# Patient Record
Sex: Female | Born: 1970 | Race: Black or African American | Hispanic: No | Smoking: Never smoker
Health system: Southern US, Community
[De-identification: ages and names within clinical notes are randomized; demographics above are authoritative.]

## PROBLEM LIST (undated history)

## (undated) DIAGNOSIS — E785 Hyperlipidemia, unspecified: Secondary | ICD-10-CM

## (undated) DIAGNOSIS — T7840XA Allergy, unspecified, initial encounter: Secondary | ICD-10-CM

## (undated) HISTORY — PX: COLONOSCOPY: SHX174

## (undated) HISTORY — PX: ABDOMINAL HYSTERECTOMY: SHX81

## (undated) HISTORY — DX: Hyperlipidemia, unspecified: E78.5

## (undated) HISTORY — DX: Allergy, unspecified, initial encounter: T78.40XA

---

## 1998-07-11 ENCOUNTER — Ambulatory Visit (HOSPITAL_COMMUNITY): Admission: RE | Admit: 1998-07-11 | Discharge: 1998-07-11 | Payer: Self-pay | Admitting: Obstetrics and Gynecology

## 1998-10-12 ENCOUNTER — Ambulatory Visit (HOSPITAL_COMMUNITY): Admission: RE | Admit: 1998-10-12 | Discharge: 1998-10-12 | Payer: Self-pay | Admitting: Obstetrics and Gynecology

## 2002-05-26 ENCOUNTER — Emergency Department (HOSPITAL_COMMUNITY): Admission: EM | Admit: 2002-05-26 | Discharge: 2002-05-26 | Payer: Self-pay | Admitting: Emergency Medicine

## 2002-05-26 ENCOUNTER — Encounter: Payer: Self-pay | Admitting: Emergency Medicine

## 2002-10-10 ENCOUNTER — Encounter (INDEPENDENT_AMBULATORY_CARE_PROVIDER_SITE_OTHER): Payer: Self-pay | Admitting: *Deleted

## 2002-10-10 ENCOUNTER — Ambulatory Visit (HOSPITAL_COMMUNITY): Admission: RE | Admit: 2002-10-10 | Discharge: 2002-10-10 | Payer: Self-pay | Admitting: *Deleted

## 2003-10-08 ENCOUNTER — Emergency Department (HOSPITAL_COMMUNITY): Admission: AD | Admit: 2003-10-08 | Discharge: 2003-10-08 | Payer: Self-pay | Admitting: Family Medicine

## 2005-03-17 ENCOUNTER — Ambulatory Visit: Payer: Self-pay | Admitting: Internal Medicine

## 2005-03-19 ENCOUNTER — Ambulatory Visit: Payer: Self-pay

## 2005-03-31 ENCOUNTER — Ambulatory Visit: Payer: Self-pay | Admitting: Internal Medicine

## 2005-04-17 ENCOUNTER — Ambulatory Visit: Payer: Self-pay | Admitting: Internal Medicine

## 2005-06-21 ENCOUNTER — Encounter: Admission: RE | Admit: 2005-06-21 | Discharge: 2005-06-21 | Payer: Self-pay | Admitting: Internal Medicine

## 2005-12-24 ENCOUNTER — Encounter: Payer: Self-pay | Admitting: Obstetrics and Gynecology

## 2006-05-26 ENCOUNTER — Ambulatory Visit: Payer: Self-pay | Admitting: Internal Medicine

## 2006-07-06 ENCOUNTER — Ambulatory Visit (HOSPITAL_COMMUNITY): Admission: RE | Admit: 2006-07-06 | Discharge: 2006-07-06 | Payer: Self-pay | Admitting: *Deleted

## 2006-07-06 ENCOUNTER — Encounter (INDEPENDENT_AMBULATORY_CARE_PROVIDER_SITE_OTHER): Payer: Self-pay | Admitting: *Deleted

## 2006-11-07 ENCOUNTER — Emergency Department (HOSPITAL_COMMUNITY): Admission: EM | Admit: 2006-11-07 | Discharge: 2006-11-07 | Payer: Self-pay | Admitting: Family Medicine

## 2007-04-29 ENCOUNTER — Ambulatory Visit: Payer: Self-pay | Admitting: Internal Medicine

## 2007-04-30 ENCOUNTER — Telehealth: Payer: Self-pay | Admitting: Internal Medicine

## 2007-05-05 ENCOUNTER — Telehealth (INDEPENDENT_AMBULATORY_CARE_PROVIDER_SITE_OTHER): Payer: Self-pay | Admitting: *Deleted

## 2008-02-08 ENCOUNTER — Ambulatory Visit: Payer: Self-pay | Admitting: Internal Medicine

## 2008-02-08 DIAGNOSIS — L02419 Cutaneous abscess of limb, unspecified: Secondary | ICD-10-CM | POA: Insufficient documentation

## 2008-02-08 DIAGNOSIS — L03119 Cellulitis of unspecified part of limb: Secondary | ICD-10-CM | POA: Insufficient documentation

## 2008-03-28 ENCOUNTER — Telehealth: Payer: Self-pay | Admitting: Internal Medicine

## 2008-03-31 ENCOUNTER — Emergency Department (HOSPITAL_COMMUNITY): Admission: EM | Admit: 2008-03-31 | Discharge: 2008-03-31 | Payer: Self-pay | Admitting: Emergency Medicine

## 2008-04-01 ENCOUNTER — Emergency Department (HOSPITAL_COMMUNITY): Admission: EM | Admit: 2008-04-01 | Discharge: 2008-04-01 | Payer: Self-pay | Admitting: Emergency Medicine

## 2008-04-03 ENCOUNTER — Encounter: Payer: Self-pay | Admitting: Internal Medicine

## 2008-04-03 ENCOUNTER — Emergency Department (HOSPITAL_COMMUNITY): Admission: EM | Admit: 2008-04-03 | Discharge: 2008-04-03 | Payer: Self-pay | Admitting: Emergency Medicine

## 2009-08-16 ENCOUNTER — Encounter (INDEPENDENT_AMBULATORY_CARE_PROVIDER_SITE_OTHER): Payer: Self-pay | Admitting: *Deleted

## 2009-11-02 ENCOUNTER — Ambulatory Visit (HOSPITAL_COMMUNITY): Admission: RE | Admit: 2009-11-02 | Discharge: 2009-11-02 | Payer: Self-pay | Admitting: Psychiatry

## 2009-11-05 ENCOUNTER — Other Ambulatory Visit (HOSPITAL_COMMUNITY): Admission: RE | Admit: 2009-11-05 | Discharge: 2009-11-19 | Payer: Self-pay | Admitting: Psychiatry

## 2009-11-08 ENCOUNTER — Ambulatory Visit: Payer: Self-pay | Admitting: Psychiatry

## 2010-04-30 ENCOUNTER — Ambulatory Visit: Payer: Self-pay | Admitting: Internal Medicine

## 2010-05-02 ENCOUNTER — Ambulatory Visit: Payer: Self-pay | Admitting: Internal Medicine

## 2010-09-26 NOTE — Progress Notes (Signed)
Summary: would like a referral  Phone Note Call from Patient Call back at [phone]   Caller: Patient Call For: drk Summary of Call: pt would like a referral to ent for ear pain. Initial call taken by: Heron Sabins,  May 05, 2007 11:05 AM  Follow-up for Phone Call        floxin otic #14 packets  Use contents two packets daily for 7 days  Additional Follow-up for Phone Call Additional follow up Details #1::        She's already had this and Z-pak. No better. Advise. Additional Follow-up by: Raechel Ache, RN,  May 05, 2007 1:55 PM    Additional Follow-up for Phone Call Additional follow up Details #2::    OK to refer to ENT  Additional Follow-up for Phone Call Additional follow up Details #3:: Details for Additional Follow-up Action Taken: 09/18 @ 2:15 with Ezzard Standing patient aware Additional Follow-up by: Florentina Addison,  May 10, 2007 11:54 AM

## 2010-09-26 NOTE — Procedures (Signed)
Summary: Colon   Colonoscopy  Procedure date:  10/10/2002  Findings:      Location:  [facility].     NAME:  Amanda Petersen, Amanda Petersen                          ACCOUNT NO.:  [id number]   MEDICAL RECORD NO.:  [id number]                   PATIENT TYPE:  AMB   LOCATION:  ENDO                                 FACILITY:  [facility]   PHYSICIAN:  Georgiana Spinner, M.D.                 DATE OF BIRTH:  1971-05-25   DATE OF PROCEDURE:  10/10/2002  DATE OF DISCHARGE:                                 OPERATIVE REPORT   PROCEDURE PERFORMED:  Colonoscopy.   ENDOSCOPIST:  Georgiana Spinner, M.D.   INDICATIONS FOR PROCEDURE:  Gastrointestinal bleeding as noted previously.  See the recent outpatient note.   ANESTHESIA:  Demerol 100 mg, Versed 10 mg.   DESCRIPTION OF PROCEDURE:  With the patient mildly sedated in the left  lateral decubitus position, the Olympus video colonoscope was inserted in  the rectum and passed under direct vision to the cecum, identified by the  crow's foot of the cecum, the ileocecal valve.  We entered into the terminal  ileum.  All of these appeared normal.  From this point the colonoscope was  slowly withdrawn taking circumferential views of the entire colonic mucosa  stopping only in the rectum which appeared normal on direct and showed small  hemorrhoids on retroflex view and pullthrough of the anal canal.  The  endoscope was straightened and withdrawn.  The patient's vital signs and  pulse oximeter remained stable.  The patient tolerated the procedure well  without apparent complications.   FINDINGS:  Small internal hemorrhoids.  Otherwise unremarkable examination.   PLAN:  Have patient follow-up with me as needed.                                                 Georgiana Spinner, M.D.    GMO/MEDQ  D:  10/10/2002  T:  10/10/2002  Job:  [id number]   cc:   Gordy Savers, M.D. [facility]   Malachi Pro. Ambrose Mantle, M.D.  [address]  [place]  [place] [address]  Fax:  [phone]

## 2010-09-26 NOTE — Assessment & Plan Note (Signed)
Summary: TB TEST // RS  Nurse Visit   Vital Signs:  Patient profile:   40 year old female Temp:     98.5 degrees F oral  Vitals Entered By: Duard Brady LPN (April 30, 2010 4:19 PM)  Allergies: 1)  ! Pcn  Immunizations Administered:  PPD Skin Test:    Vaccine Type: PPD    Site: right forearm    Mfr: Sanofi Pasteur    Dose: 0.1 ml    Route: ID    Given by: Duard Brady LPN    Exp. Date: 06/27/2011    Lot #: [id number]    Physician counseled: yes  Orders Added: 1)  TB Skin Test [86580] 2)  Admin 1st Vaccine [90471]  pt has cxp on thursday - will read then , also has form to be filled otu at that time. KIK

## 2010-09-26 NOTE — Assessment & Plan Note (Signed)
Summary: CPX / CPX LABS V70.0 / TB READING / PT NEEDS FORM FOR JOB FIL...   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  History of Present Illness: 28 -year-old patient who is seen today for a wellness exam.  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  She has a gynecologist and had a normal Pap specimen last year.  No concerns or complaints.  a PPD was placed 48 hours ago, which was read today and was negative  Allergies: 1)  ! Pcn  Past History:  Past Medical History: Reviewed history from 04/29/2007 and no changes required. Unremarkable  Past Surgical History: Reviewed history from 04/29/2007 and no changes required. Colonoscopy Colectomy Hysterectomy  Family History: Reviewed history from 04/29/2007 and no changes required. Family History of Colon CA 1st degree relative <60 Family History Hypertension  Social History: Reviewed history from 04/29/2007 and no changes required. Occupation: [employer] Single Never Smoked Alcohol use-no  Review of Systems  The patient denies anorexia, fever, weight loss, weight gain, vision loss, decreased hearing, hoarseness, chest pain, syncope, dyspnea on exertion, peripheral edema, prolonged cough, headaches, hemoptysis, abdominal pain, melena, hematochezia, severe indigestion/heartburn, hematuria, incontinence, genital sores,  muscle weakness, suspicious skin lesions, transient blindness, difficulty walking, depression, unusual weight change, abnormal bleeding, enlarged lymph nodes, angioedema, and breast masses.    Physical Exam  General:  overweight-appearing.  normal blood pressure Head:  Normocephalic and atraumatic without obvious abnormalities. No apparent alopecia or balding. Eyes:  No corneal or conjunctival inflammation noted. EOMI. Perrla. Funduscopic exam benign, without hemorrhages, exudates or papilledema. Vision grossly normal. Ears:  External ear exam shows no significant lesions or deformities.  Otoscopic examination reveals clear canals, tympanic membranes are intact bilaterally without bulging, retraction, inflammation or discharge. Hearing is grossly normal bilaterally. Nose:  External nasal examination shows no deformity or inflammation. Nasal mucosa are pink and moist without lesions or exudates. Mouth:  Oral mucosa and oropharynx without lesions or exudates.  Teeth in good repair. Neck:  No deformities, masses, or tenderness noted. Chest Wall:  No deformities, masses, or tenderness noted. Lungs:  Normal respiratory effort, chest expands symmetrically. Lungs are clear to auscultation, no crackles or wheezes. Heart:  Normal rate and regular rhythm. S1 and S2 normal without gallop, murmur, click, rub or other extra sounds. Abdomen:  Bowel sounds positive,abdomen soft and non-tender without masses, organomegaly or hernias noted. Msk:  No deformity or scoliosis noted of thoracic or lumbar spine.   Pulses:  R and L carotid,radial,femoral,dorsalis pedis and posterior tibial pulses are full and equal bilaterally Extremities:  No clubbing, cyanosis, edema, or deformity noted with normal full range of motion of all joints.  Neurologic:  No cranial nerve deficits noted. Station and gait are normal. Plantar reflexes are down-going bilaterally. DTRs are symmetrical throughout. Sensory, motor and coordinative  functions appear intact. Skin:  Intact without suspicious lesions or rashes Cervical Nodes:  No lymphadenopathy noted Psych:  Cognition and judgment appear intact. Alert and cooperative with normal attention span and concentration. No apparent delusions, illusions, hallucinations   Impression & Recommendations:  Problem # 1:  Preventive Health Care (ICD-V70.0)  Complete Medication List: 1)  Miralax Powd (Polyethylene glycol 3350)  Patient Instructions: 1)  It is important that you exercise regularly at least 20 minutes 5 times a week. If you develop chest pain, have severe difficulty breathing, or feel very tired , stop exercising immediately and seek medical attention. 2)  You need to lose weight. Consider a lower calorie diet and regular exercise.    PPD Results    Date of reading: 05/02/2010    Results: < 5mm    Interpretation: negative

## 2010-09-26 NOTE — Letter (Signed)
Summary: Health Examination Certificate for [facility] Examination Certificate for School   Imported By: Maryln Gottron 05/06/2010 14:10:30  _____________________________________________________________________  External Attachment:    Type:   Image     Comment:   External Document

## 2010-09-26 NOTE — Procedures (Signed)
Summary: Colon   Colonoscopy  Procedure date:  07/06/2006  Findings:      Location:  [facility].   NAME:  Amanda Petersen, Amanda Petersen                ACCOUNT NO.:  [id number]   MEDICAL RECORD NO.:  [id number]          PATIENT TYPE:  AMB   LOCATION:  ENDO                         FACILITY:  [facility]   PHYSICIAN:  Georgiana Spinner, M.D.    DATE OF BIRTH:  04-24-1971   DATE OF PROCEDURE:  07/06/2006  DATE OF DISCHARGE:                                 OPERATIVE REPORT   PROCEDURE:  Colonoscopy.   INDICATIONS:  Rectal bleeding.   ANESTHESIA:  Fentanyl 75 mcg, Versed 6 mg.   PROCEDURE:  With the patient mildly sedated in the left lateral decubitus  position, a rectal examination was performed which was unremarkable.  Perineum was unremarkable.  Subsequently the Olympus videoscopic colonoscope  was inserted into the rectum and passed under direct vision to the cecum  identified by ileocecal valve and appendiceal orifice both which were  photographed.  From this point colonoscope was slowly withdrawn taking  circumferential views of colonic mucosa stopping in the rectum which  appeared normal on direct, showed hemorrhoids on retroflexed view.  There  was no evidence of fissure or other pathology in the rectum, the endoscope  was straightened and withdrawn through the anal canal which appeared  unremarkable.  The patient vital signs, pulse oximeter remained stable.  The  patient tolerated procedure well without apparent complication.   FINDINGS:  Internal hemorrhoids, otherwise an unremarkable colonoscopic  examination to the cecum.  Presume that bleeding was coming from the  hemorrhoids.   PLAN:  Will have patient follow-up with me as needed.           ______________________________  Georgiana Spinner, M.D.     GMO/MEDQ  D:  07/06/2006  T:  07/06/2006  Job:  [id number]   cc:   Gordy Savers, MD

## 2010-09-26 NOTE — Letter (Signed)
Summary: New Patient letter  [facility]  [address] [place], [place] [address]   Phone: [phone]  Fax: [phone]       08/16/2009 MRN: [id number]  Amanda Petersen [address] [place], [place]  [address]  Dear Amanda Petersen,  Welcome to the Gastroenterology Division at [facility].    You are scheduled to see Dr.  Rob Bunting on September 11, 2009 at 10:30am on the 3rd floor at [facility], [address]  We ask that you try to arrive at our office 15 minutes prior to your appointment time to allow for check-in.  We would like you to complete the enclosed self-administered evaluation form prior to your visit and bring it with you on the day of your appointment.  We will review it with you.  Also, please bring a complete list of all your medications or, if you prefer, bring the medication bottles and we will list them.  Please bring your insurance card so that we may make a copy of it.  If your insurance requires a referral to see a specialist, please bring your referral form from your primary care physician.  Co-payments are due at the time of your visit and may be paid by cash, check or credit card.     Your office visit will consist of a consult with your physician (includes a physical exam), any laboratory testing he/she may order, scheduling of any necessary diagnostic testing (e.g. x-ray, ultrasound, CT-scan), and scheduling of a procedure (e.g. Endoscopy, Colonoscopy) if required.  Please allow enough time on your schedule to allow for any/all of these possibilities.    If you cannot keep your appointment, please call [phone] to cancel or reschedule prior to your appointment date.  This allows [place] the opportunity to schedule an appointment for another patient in need of care.  If you do not cancel or reschedule by 5 p.m. the business day prior to your appointment date, you will be charged a $50.00 late cancellation/no-show fee.    Thank you for  choosing [facility] for your medical needs.  We appreciate the opportunity to care for you.  Please visit [place] at our website  to learn more about our practice.                     Sincerely,                                                             The Gastroenterology Division

## 2010-09-26 NOTE — Consult Note (Signed)
Summary: [facility]  [facility]   Imported By: Maryln Gottron 04/19/2008 14:46:06  _____________________________________________________________________  External Attachment:    Type:   Image     Comment:   External Document

## 2010-09-26 NOTE — Assessment & Plan Note (Signed)
Summary: NOT FEELING WELL/PT TO COME AT 10AM/CCM   Vital Signs:  Patient Profile:   40 Years Old Female Weight:      200 pounds Temp:     98.4 degrees F oral Pulse rhythm:   regular Resp:     12 per minute BP sitting:   120 / 82  Vitals Entered By: Lynann Beaver CMA (April 29, 2007 10:06 AM)                 Chief Complaint:  c/o right sided headache and ear pain.  Acute Visit History:      The patient complains of earache.  These symptoms began 2 days ago.  Other comments include: no fever sxs got worse last night. .        The earache is located on the right side.  There is no history of recent antibiotic usage, cold/URI symptoms, or recurrent otitis media associated with the earache.         Current Allergies: ! PCN  Past Medical History:    Unremarkable     Review of Systems       no other complaints, no hearing loss   Physical Exam  General:     Well-developed,well-nourished,in no acute distress; alert,appropriate and cooperative throughout examination Head:     Normocephalic and atraumatic without obvious abnormalities. No apparent alopecia or balding. Ears:     L ear normal and no external deformities.   r ear canal tender and swollen Neck:     No deformities, masses, or tenderness noted.    Impression & Recommendations:  Problem # 1:  OTITIS EXTERNA (ICD-380.10)  Her updated medication list for this problem includes:    Cortisporin 3.5-10000-1 Soln (Neomycin-polymyxin-hc) .Marland Kitchen... 2 drops right ear three times a day for 5 days   Complete Medication List: 1)  Miralax Powd (Polyethylene glycol 3350) 2)  Vicodin 5-500 Mg Tabs (Hydrocodone-acetaminophen) 3)  Cortisporin 3.5-10000-1 Soln (Neomycin-polymyxin-hc) .... 2 drops right ear three times a day for 5 days     Prescriptions: CORTISPORIN 3.5-10000-1  SOLN (NEOMYCIN-POLYMYXIN-HC) 2 drops right ear three times a day for 5 days  #1 vial x 0   Entered and Authorized by:   Birdie Sons MD   Signed by:   Birdie Sons MD on 04/29/2007   Method used:   Electronically sent to ...       [id number] [address]       [address]       [place], [place]  [address]       Ph: [phone] or [phone]       Fax: [phone]   RxID:   [id number]

## 2010-09-26 NOTE — Progress Notes (Signed)
Summary: NO SHOW  Phone Note Outgoing Call   Call placed by: Raechel Ache, RN,  March 28, 2008 10:19 AM Summary of Call: [facility] re missed appt.

## 2010-09-26 NOTE — Assessment & Plan Note (Signed)
Summary: ? infected scrape/ccm   Vital Signs:  Patient Profile:   40 Years Old Female Height:     67 inches Weight:      209 pounds Temp:     98.1 degrees F BP sitting:   114 / 82  (left arm) Cuff size:   regular  Vitals Entered By: Raechel Ache, RN (February 08, 2008 4:43 PM)                 Chief Complaint:  C/o carpet burn RLE x 10 days.Marland Kitchen  History of Present Illness: 40 year old who suffered some soft tissue trauma involving her right lateral lower leg approximately 10 days ago.  She has been treating the wound with antibiotic ointment and peroxide cleansing.  For the past few days.  She developed increasing pain and a surrounding rim of erythema    Current Allergies: ! PCN      Physical Exam  General:     overweight-appearing.   Skin:     there is an approximate 3-cm crusted lesion involving her right lateral lower leg.  There is surrounding erythema and slight tenderness    Impression & Recommendations:  Problem # 1:  CELLULITIS, RIGHT LEG (ICD-682.6)  Her updated medication list for this problem includes:    Cephalexin 500 Mg Caps (Cephalexin) ..... One twice daily   Complete Medication List: 1)  Miralax Powd (Polyethylene glycol 3350) 2)  Vicodin 5-500 Mg Tabs (Hydrocodone-acetaminophen) 3)  Cephalexin 500 Mg Caps (Cephalexin) .... One twice daily   Patient Instructions: 1)   clean the wound twice daily; 2)  call if pain, redness, or fever develops 3)  Take your antibiotic as prescribed until ALL of it is gone, but stop if you develop a rash or swelling and contact our office as soon as possible.   Prescriptions: CEPHALEXIN 500 MG  CAPS (CEPHALEXIN) one twice daily  #14 x 0   Entered and Authorized by:   Gordy Savers  MD   Signed by:   Gordy Savers  MD on 02/08/2008   Method used:   Print then Give to Patient   RxID:   [id number]  ]

## 2010-09-26 NOTE — Progress Notes (Signed)
Summary: NEEDS RX  Phone Note Call from Patient Call back at [phone]   Caller: Patient Call For: DR KWIA/DR Leaha Cuervo Reason for Call: Talk to Doctor Summary of Call: PT SAW DR Murriel Holwerda YESTERDAY AND WAS GIVER EAR DROPS FOR HER EARS. IT IS GETTING WORSE. SHE DOES NOT WANTS TO COME IN. SHE [facility]S [phone]. SHE IS HAVING A LOT OF PAIN. Initial call taken by: Warnell Forester,  April 30, 2007 12:27 PM  Follow-up for Phone Call        continue ear drops add zpack  Follow-up by: Birdie Sons MD,  April 30, 2007 3:49 PM  Additional Follow-up for Phone Call Additional follow up Details #1::        [facility] [address], pt informed. Additional Follow-up by: Sid Falcon LPN,  April 30, 2007 4:01 PM

## 2011-01-10 NOTE — Op Note (Signed)
NAMEZEINAB, RODWELL NO.:  [id number]   MEDICAL RECORD NO.:  [id number]          PATIENT TYPE:  AMB   LOCATION:  ENDO                         FACILITY:  [facility]   PHYSICIAN:  Georgiana Spinner, M.D.    DATE OF BIRTH:  01-31-1971   DATE OF PROCEDURE:  07/06/2006  DATE OF DISCHARGE:                                 OPERATIVE REPORT   PROCEDURE:  Colonoscopy.   INDICATIONS:  Rectal bleeding.   ANESTHESIA:  Fentanyl 75 mcg, Versed 6 mg.   PROCEDURE:  With the patient mildly sedated in the left lateral decubitus  position, a rectal examination was performed which was unremarkable.  Perineum was unremarkable.  Subsequently the Olympus videoscopic colonoscope  was inserted into the rectum and passed under direct vision to the cecum  identified by ileocecal valve and appendiceal orifice both which were  photographed.  From this point colonoscope was slowly withdrawn taking  circumferential views of colonic mucosa stopping in the rectum which  appeared normal on direct, showed hemorrhoids on retroflexed view.  There  was no evidence of fissure or other pathology in the rectum, the endoscope  was straightened and withdrawn through the anal canal which appeared  unremarkable.  The patient vital signs, pulse oximeter remained stable.  The  patient tolerated procedure well without apparent complication.   FINDINGS:  Internal hemorrhoids, otherwise an unremarkable colonoscopic  examination to the cecum.  Presume that bleeding was coming from the  hemorrhoids.   PLAN:  Will have patient follow-up with me as needed.           ______________________________  Georgiana Spinner, M.D.     GMO/MEDQ  D:  07/06/2006  T:  07/06/2006  Job:  [id number]   cc:   Gordy Savers, MD

## 2011-01-10 NOTE — Op Note (Signed)
   NAME:  Amanda Petersen, Amanda Petersen                          ACCOUNT NO.:  [id number]   MEDICAL RECORD NO.:  [id number]                   PATIENT TYPE:  AMB   LOCATION:  ENDO                                 FACILITY:  [facility]   PHYSICIAN:  Georgiana Spinner, M.D.                 DATE OF BIRTH:  09-01-1970   DATE OF PROCEDURE:  10/10/2002  DATE OF DISCHARGE:                                 OPERATIVE REPORT   PROCEDURE PERFORMED:  Colonoscopy.   ENDOSCOPIST:  Georgiana Spinner, M.D.   INDICATIONS FOR PROCEDURE:  Gastrointestinal bleeding as noted previously.  See the recent outpatient note.   ANESTHESIA:  Demerol 100 mg, Versed 10 mg.   DESCRIPTION OF PROCEDURE:  With the patient mildly sedated in the left  lateral decubitus position, the Olympus video colonoscope was inserted in  the rectum and passed under direct vision to the cecum, identified by the  crow's foot of the cecum, the ileocecal valve.  We entered into the terminal  ileum.  All of these appeared normal.  From this point the colonoscope was  slowly withdrawn taking circumferential views of the entire colonic mucosa  stopping only in the rectum which appeared normal on direct and showed small  hemorrhoids on retroflex view and pullthrough of the anal canal.  The  endoscope was straightened and withdrawn.  The patient's vital signs and  pulse oximeter remained stable.  The patient tolerated the procedure well  without apparent complications.   FINDINGS:  Small internal hemorrhoids.  Otherwise unremarkable examination.   PLAN:  Have patient follow-up with me as needed.                                                 Georgiana Spinner, M.D.    GMO/MEDQ  D:  10/10/2002  T:  10/10/2002  Job:  [id number]   cc:   Gordy Savers, M.D. [facility]   Malachi Pro. Ambrose Mantle, M.D.  [address]  [place]  [place] [address]  Fax: [phone]

## 2011-10-28 ENCOUNTER — Encounter: Payer: Self-pay | Admitting: Internal Medicine

## 2011-10-28 ENCOUNTER — Ambulatory Visit (INDEPENDENT_AMBULATORY_CARE_PROVIDER_SITE_OTHER): Payer: Managed Care, Other (non HMO) | Admitting: Internal Medicine

## 2011-10-28 VITALS — BP 110/83 | Temp 101.1°F | Wt 204.0 lb

## 2011-10-28 DIAGNOSIS — K529 Noninfective gastroenteritis and colitis, unspecified: Secondary | ICD-10-CM

## 2011-10-28 DIAGNOSIS — K5289 Other specified noninfective gastroenteritis and colitis: Secondary | ICD-10-CM

## 2011-10-28 MED ORDER — PROMETHAZINE HCL 25 MG PO TABS: 25.0000 mg | ORAL_TABLET | Freq: Three times a day (TID) | ORAL | Status: DC | PRN

## 2011-10-28 NOTE — Progress Notes (Signed)
  Subjective:    Patient ID: Amanda Petersen, female    DOB: 01-10-1971, 41 y.o.   MRN: [id number]  HPI  41 year old patient who presents with a two-day history of high fever weakness malaise and intermittent diarrhea. She has had significant nausea. She was unable to work today and do to profound weakness and fever. Her husband had a similar illness that lasted 3 days. No vomiting denies any abdominal pain or distention    Review of Systems  Constitutional: Positive for fever, activity change, appetite change and fatigue.  HENT: Negative for hearing loss, congestion, sore throat, rhinorrhea, dental problem, sinus pressure and tinnitus.   Eyes: Negative for pain, discharge and visual disturbance.  Respiratory: Negative for cough and shortness of breath.   Cardiovascular: Negative for chest pain, palpitations and leg swelling.  Gastrointestinal: Positive for nausea and diarrhea. Negative for vomiting, abdominal pain, constipation, blood in stool and abdominal distention.  Genitourinary: Negative for dysuria, urgency, frequency, hematuria, flank pain, vaginal bleeding, vaginal discharge, difficulty urinating, vaginal pain and pelvic pain.  Musculoskeletal: Negative for joint swelling, arthralgias and gait problem.  Skin: Negative for rash.  Neurological: Negative for dizziness, syncope, speech difficulty, weakness, numbness and headaches.  Hematological: Negative for adenopathy.  Psychiatric/Behavioral: Negative for behavioral problems, dysphoric mood and agitation. The patient is not nervous/anxious.        Objective:   Physical Exam  Constitutional: She is oriented to person, place, and time. She appears well-developed and well-nourished.       Appears unwell but in no acute distress Temperature 101 Blood pressure normal  HENT:  Head: Normocephalic.  Right Ear: External ear normal.  Left Ear: External ear normal.  Mouth/Throat: Oropharynx is clear and moist.  Eyes: Conjunctivae and  EOM are normal. Pupils are equal, round, and reactive to light.  Neck: Normal range of motion. Neck supple. No thyromegaly present.  Cardiovascular: Normal rate, regular rhythm, normal heart sounds and intact distal pulses.   Pulmonary/Chest: Effort normal and breath sounds normal.  Abdominal: Soft. Bowel sounds are normal. She exhibits no mass. There is no tenderness. There is no rebound and no guarding.  Musculoskeletal: Normal range of motion.  Lymphadenopathy:    She has no cervical adenopathy.  Neurological: She is alert and oriented to person, place, and time.  Skin: Skin is warm and dry. No rash noted.  Psychiatric: She has a normal mood and affect. Her behavior is normal.          Assessment & Plan:   Viral gastroenteritis. Will treat symptomatically. Prescription for Phenergan called in.

## 2011-10-28 NOTE — Patient Instructions (Signed)
The main concern with gastroenteritis is dehydration.  Drink  plenty of  fluids, and advance your diet  slowly to solids as you feel improved.  If you are unable to keep anything down or  Show  signs of dehydration such as dry, cracked lips, or  not urinating, please notify our office.  Phenergan 25 mg every 6 hours as needed for nausea  Prilosec one daily Align  one daily  Tylenol 1-2 tabs po q4h prn

## 2012-11-23 ENCOUNTER — Emergency Department (HOSPITAL_COMMUNITY)
Admission: EM | Admit: 2012-11-23 | Discharge: 2012-11-23 | Disposition: A | Discharge status: Home / Self Care | Payer: Self-pay | Source: Home / Self Care

## 2013-02-06 ENCOUNTER — Emergency Department (INDEPENDENT_AMBULATORY_CARE_PROVIDER_SITE_OTHER): Payer: BC Managed Care – PPO

## 2013-02-06 ENCOUNTER — Encounter (HOSPITAL_COMMUNITY): Payer: Self-pay | Admitting: *Deleted

## 2013-02-06 ENCOUNTER — Emergency Department (HOSPITAL_COMMUNITY)
Admission: EM | Admit: 2013-02-06 | Discharge: 2013-02-06 | Disposition: A | Discharge status: Home / Self Care | Payer: BC Managed Care – PPO | Source: Home / Self Care | Attending: Emergency Medicine | Admitting: Emergency Medicine

## 2013-02-06 DIAGNOSIS — S92001A Unspecified fracture of right calcaneus, initial encounter for closed fracture: Secondary | ICD-10-CM

## 2013-02-06 DIAGNOSIS — S93409A Sprain of unspecified ligament of unspecified ankle, initial encounter: Secondary | ICD-10-CM

## 2013-02-06 DIAGNOSIS — S92009A Unspecified fracture of unspecified calcaneus, initial encounter for closed fracture: Secondary | ICD-10-CM

## 2013-02-06 DIAGNOSIS — S93401A Sprain of unspecified ligament of right ankle, initial encounter: Secondary | ICD-10-CM

## 2013-02-06 DIAGNOSIS — W010XXA Fall on same level from slipping, tripping and stumbling without subsequent striking against object, initial encounter: Secondary | ICD-10-CM

## 2013-02-06 MED ORDER — HYDROCODONE-ACETAMINOPHEN 7.5-325 MG PO TABS: 1.0000 | ORAL_TABLET | Freq: Four times a day (QID) | ORAL | Status: DC | PRN

## 2013-02-06 NOTE — ED Notes (Signed)
Patient states she twisted her ankle in the shower this morning; swelling and bruising.

## 2013-02-06 NOTE — ED Provider Notes (Signed)
History     CSN: [id number]  Arrival date & time 02/06/13  1110   First MD Initiated Contact with Patient 02/06/13 1302      Chief Complaint  Patient presents with  . Ankle Pain    (Consider location/radiation/quality/duration/timing/severity/associated sxs/prior treatment) HPI  41yo bf presents today with right lat ankle/foot pain and swelling.  States that she got out of the shower this morning, slipped and suffered a plantar flexion/inversion injury to her ankle.  Marked discomfort after injury along with swelling.  Difficulty weightbearing.  Has had some numbness lat foot.  Denies any other injury.  Only previous issue with this foot has been plantar fasciitis.    History reviewed. No pertinent past medical history.  Past Surgical History  Procedure Laterality Date  . Abdominal hysterectomy      No family history on file.  History  Substance Use Topics  . Smoking status: Never Smoker   . Smokeless tobacco: Never Used  . Alcohol Use: No    OB History   Grav Para Term Preterm Abortions TAB SAB Ect Mult Living                  Review of Systems  Constitutional: Negative.   HENT: Negative.   Eyes: Negative.   Respiratory: Negative.   Cardiovascular: Negative.   Gastrointestinal: Negative.   Endocrine: Negative.   Genitourinary: Negative.   Musculoskeletal: Positive for joint swelling (right lat ankle and foot) and arthralgias (lateral right ankle ).  Skin: Negative.   Neurological: Negative.     Allergies  Penicillins  Home Medications   Current Outpatient Rx  Name  Route  Sig  Dispense  Refill  . EXPIRED: promethazine (PHENERGAN) 25 MG tablet   Oral   Take 1 tablet (25 mg total) by mouth every 8 (eight) hours as needed for nausea.   20 tablet   0     BP 157/95  Pulse 107  Temp(Src) 99.5 F (37.5 C) (Oral)  Resp 20  SpO2 100%  Physical Exam  Constitutional: She is oriented to person, place, and time. She appears well-developed and  well-nourished.  HENT:  Head: Normocephalic and atraumatic.  Eyes: EOM are normal. Pupils are equal, round, and reactive to light.  Neck: Normal range of motion.  Cardiovascular: Normal rate, regular rhythm and normal heart sounds.   Pulmonary/Chest: Effort normal and breath sounds normal.  Musculoskeletal:  Right lateral foot mod swelling and mod-severe tenderness. Mod tender over the ATFL.  Difficult to assess ligament stability due to pain.  NVI.  Good ankle range of motion.    Neurological: She is alert and oriented to person, place, and time.  Skin: Skin is warm.  Psychiatric: She has a normal mood and affect.    ED Course  Procedures (including critical care time)  Labs Reviewed - No data to display Dg Ankle Complete Right  02/06/2013   *RADIOLOGY REPORT*  Clinical Data: Injury with right ankle and foot pain and lateral swelling.  RIGHT ANKLE - COMPLETE 3+ VIEW  Comparison: None.  Findings: Extensive lateral soft tissue swelling is identified. There is a bone fragment off the lateral margin of the distal calcaneus likely due to the extensor digitorum brevis avulsion fracture.  IMPRESSION: Findings most consistent with an avulsion fracture of the origin of the extensor digitorum brevis.   Original Report Authenticated By: Holley Dexter, M.D.   Dg Foot 2 Views Right  02/06/2013   *RADIOLOGY REPORT*  Clinical Data: Injury.  Lateral  right foot pain and swelling.  RIGHT FOOT - 2 VIEW  Comparison: None.  Findings: A bony fragment is seen off the distal margin of the calcaneus on the lateral side most consistent with an avulsion fracture at the origin of the extensor digitorum brevis.  Overlying soft tissue swelling is noted.  No other acute abnormality is identified.  IMPRESSION: Avulsion fracture of the distal calcaneus at the origin of the extensor digitorum brevis.  Associated soft tissue swelling noted.   Original Report Authenticated By: Holley Dexter, M.D.     No diagnosis  found.    MDM  Patient was put in a cam walker and given crutches.  Can wbat in boot only.  Will f/u with [facility] this week.  Call to make an appt.  F/u with [place] as needed.  Discharge instructions given.     Meds ordered this encounter  Medications  . HYDROcodone-acetaminophen (NORCO) 7.5-325 MG per tablet    Sig: Take 1 tablet by mouth every 6 (six) hours as needed for pain.    Dispense:  50 tablet    Refill:  0          Naida Sleight, PA-C 02/06/13 1327

## 2013-02-06 NOTE — ED Notes (Signed)
Patient refused crutches she states she will obtain crutches from elsewhere.

## 2013-05-03 ENCOUNTER — Encounter: Payer: Self-pay | Admitting: Family Medicine

## 2013-05-03 ENCOUNTER — Ambulatory Visit (INDEPENDENT_AMBULATORY_CARE_PROVIDER_SITE_OTHER): Payer: BC Managed Care – PPO | Admitting: Family Medicine

## 2013-05-03 VITALS — BP 118/78 | Temp 98.8°F | Wt 224.0 lb

## 2013-05-03 DIAGNOSIS — J398 Other specified diseases of upper respiratory tract: Secondary | ICD-10-CM

## 2013-05-03 DIAGNOSIS — J029 Acute pharyngitis, unspecified: Secondary | ICD-10-CM

## 2013-05-03 DIAGNOSIS — J399 Disease of upper respiratory tract, unspecified: Secondary | ICD-10-CM

## 2013-05-03 DIAGNOSIS — E669 Obesity, unspecified: Secondary | ICD-10-CM

## 2013-05-03 NOTE — Addendum Note (Signed)
Addended by: Azucena Freed on: 05/03/2013 02:50 PM   Modules accepted: Orders

## 2013-05-03 NOTE — Progress Notes (Signed)
Chief Complaint  Patient presents with  . Sore Throat    hoarseness, fever     HPI:  Acute visit for sore throat: -started about 4-5 days ago -has allergies and takes zyrtec for this -symptoms: had low grade fever a few days ago, but that resolved, sore throat, hoarseness, drainage, nasal congestion, ears full -denies: fever in last few days, SOB, NVD, tooth pain  Weight issues: -wants a weight loss medication  ROS: See pertinent positives and negatives per HPI.  No past medical history on file.  Past Surgical History  Procedure Laterality Date  . Abdominal hysterectomy      No family history on file.  History   Social History  . Marital Status: Divorced    Spouse Name: N/A    Number of Children: N/A  . Years of Education: N/A   Social History Main Topics  . Smoking status: Never Smoker   . Smokeless tobacco: Never Used  . Alcohol Use: No  . Drug Use: No  . Sexual Activity: None   Other Topics Concern  . None   Social History Narrative  . None    Current outpatient prescriptions:HYDROcodone-acetaminophen (NORCO) 7.5-325 MG per tablet, Take 1 tablet by mouth every 6 (six) hours as needed for pain., Disp: 50 tablet, Rfl: 0;  promethazine (PHENERGAN) 25 MG tablet, Take 1 tablet (25 mg total) by mouth every 8 (eight) hours as needed for nausea., Disp: 20 tablet, Rfl: 0  EXAM:  Filed Vitals:   05/03/13 1426  BP: 118/78  Temp: 98.8 F (37.1 C)    Body mass index is 35.62 kg/(m^2).  GENERAL: vitals reviewed and listed above, alert, oriented, appears well hydrated and in no acute distress  HEENT: atraumatic, conjunttiva clear, no obvious abnormalities on inspection of external nose and ears, normal appearance of ear canals and TMs, clear nasal congestion, mild post oropharyngeal erythema with PND, no tonsillar edema or exudate, no sinus TTP  NECK: no obvious masses on inspection  LUNGS: clear to auscultation bilaterally, no wheezes, rales or rhonchi, good  air movement  CV: HRRR, no peripheral edema  MS: moves all extremities without noticeable abnormality  PSYCH: pleasant and cooperative, no obvious depression or anxiety  ASSESSMENT AND PLAN:  Discussed the following assessment and plan:  Upper respiratory disease  Obesity, unspecified  Sore throat  -symptoms c/w with most likely a VURI - advised supportive measures and return precautions - strep culture pending due to pt concern -advised she should see her PCP regarding weight loss medications and advised her of the [place] [facility] and advised diet and exercise in the meantime -Patient advised to return or notify a doctor immediately if symptoms worsen or persist or new concerns arise.  Patient Instructions  -INSTRUCTIONS FOR UPPER RESPIRATORY INFECTION:  -plenty of rest and fluids  -nasal saline wash 2-3 times daily (use prepackaged nasal saline or bottled/distilled water if making your own)   -can use sinex or afrin nasal spray for drainage and nasal congestion - but do NOT use longer then 3-4 days  -can use tylenol or ibuprofen as directed for aches and sorethroat  -in the winter time, using a humidifier at night is helpful (please follow cleaning instructions)  -if you are taking a cough medication - use only as directed, may also try a teaspoon of honey to coat the throat and throat lozenges  -for sore throat, salt water gargles can help  -follow up if you have fevers, facial pain, tooth pain, difficulty breathing  or are worsening or not getting better in 5-7 days      Joao Mccurdy, [facility] R.

## 2013-05-03 NOTE — Patient Instructions (Addendum)
-  INSTRUCTIONS FOR UPPER RESPIRATORY INFECTION:  -plenty of rest and fluids  -nasal saline wash 2-3 times daily (use prepackaged nasal saline or bottled/distilled water if making your own)   -can use sinex or afrin nasal spray for drainage and nasal congestion - but do NOT use longer then 3-4 days  -can use tylenol or ibuprofen as directed for aches and sorethroat  -in the winter time, using a humidifier at night is helpful (please follow cleaning instructions)  -if you are taking a cough medication - use only as directed, may also try a teaspoon of honey to coat the throat and throat lozenges  -for sore throat, salt water gargles can help  -follow up if you have fevers, facial pain, tooth pain, difficulty breathing or are worsening or not getting better in 5-7 days   For your weight: We recommend the following healthy lifestyle measures: - eat a healthy diet consisting of lots of vegetables, fruits, beans, nuts, seeds, healthy meats such as white chicken and fish and whole grains.  - avoid fried foods, fast food, processed foods, sodas, red meet and other fattening foods.  - get a least 150 minutes of aerobic exercise per week.  Follow up with Dr. Amador Cunas

## 2013-05-05 LAB — CULTURE, GROUP A STREP: Organism ID, Bacteria: NORMAL

## 2013-05-10 NOTE — Progress Notes (Signed)
Quick Note:  Left a message for return call. ______ 

## 2013-05-12 NOTE — Progress Notes (Signed)
Quick Note:  Called and spoke with pt and pt is aware. Pt states she is feeling better. ______ 

## 2013-05-12 NOTE — Progress Notes (Signed)
Quick Note:  Left a message for return call. ______ 

## 2013-11-08 ENCOUNTER — Ambulatory Visit (INDEPENDENT_AMBULATORY_CARE_PROVIDER_SITE_OTHER): Payer: BC Managed Care – PPO | Admitting: Internal Medicine

## 2013-11-08 ENCOUNTER — Encounter: Payer: Self-pay | Admitting: Internal Medicine

## 2013-11-08 VITALS — BP 120/70 | HR 87 | Temp 98.9°F | Resp 20 | Ht 66.5 in | Wt 225.0 lb

## 2013-11-08 DIAGNOSIS — R300 Dysuria: Secondary | ICD-10-CM

## 2013-11-08 DIAGNOSIS — N390 Urinary tract infection, site not specified: Secondary | ICD-10-CM

## 2013-11-08 DIAGNOSIS — R350 Frequency of micturition: Secondary | ICD-10-CM

## 2013-11-08 LAB — POCT URINALYSIS DIPSTICK
Blood, UA: NEGATIVE
Glucose, UA: NEGATIVE
Ketones, UA: 15
Nitrite, UA: NEGATIVE
Protein, UA: NEGATIVE
Spec Grav, UA: >=1.03
Urobilinogen, UA: 0.2
pH, UA: 6.5

## 2013-11-08 MED ORDER — CIPROFLOXACIN HCL 500 MG PO TABS: 500.0000 mg | ORAL_TABLET | Freq: Two times a day (BID) | ORAL | Status: DC

## 2013-11-08 NOTE — Progress Notes (Signed)
Pre-visit discussion using our clinic review tool. No additional management support is needed unless otherwise documented below in the visit note.  

## 2013-11-08 NOTE — Progress Notes (Signed)
   Subjective:    Patient ID: Amanda Petersen, female    DOB: 02-02-1971, 43 y.o.   MRN: [id number]  HPI  43 year old patient who presents with a 4 to five-day history of recent dysuria, frequency, and nocturia.  No fever, flank pain, or chills.  She has had only rare bladder infections in the past.  No vaginal discharge  History reviewed. No pertinent past medical history.  History   Social History  . Marital Status: Divorced    Spouse Name: N/A    Number of Children: N/A  . Years of Education: N/A   Occupational History  . Not on file.   Social History Main Topics  . Smoking status: Never Smoker   . Smokeless tobacco: Never Used  . Alcohol Use: No  . Drug Use: No  . Sexual Activity: Not on file   Other Topics Concern  . Not on file   Social History Narrative  . No narrative on file    Past Surgical History  Procedure Laterality Date  . Abdominal hysterectomy      No family history on file.  Allergies  Allergen Reactions  . Penicillins     No current outpatient prescriptions on file prior to visit.   No current facility-administered medications on file prior to visit.    BP 120/70  Pulse 87  Temp(Src) 98.9 F (37.2 C) (Oral)  Resp 20  Ht 5' 6.5" (1.689 m)  Wt 225 lb (102.059 kg)  BMI 35.78 kg/m2  SpO2 96%       Review of Systems  Constitutional: Negative.   HENT: Negative for congestion, dental problem, hearing loss, rhinorrhea, sinus pressure, sore throat and tinnitus.   Eyes: Negative for pain, discharge and visual disturbance.  Respiratory: Negative for cough and shortness of breath.   Cardiovascular: Negative for chest pain, palpitations and leg swelling.  Gastrointestinal: Negative for nausea, vomiting, abdominal pain, diarrhea, constipation, blood in stool and abdominal distention.  Genitourinary: Positive for dysuria, urgency and frequency. Negative for hematuria, flank pain, vaginal bleeding, vaginal discharge, difficulty urinating,  vaginal pain and pelvic pain.  Musculoskeletal: Negative for arthralgias, gait problem and joint swelling.  Skin: Negative for rash.  Neurological: Negative for dizziness, syncope, speech difficulty, weakness, numbness and headaches.  Hematological: Negative for adenopathy.  Psychiatric/Behavioral: Negative for behavioral problems, dysphoric mood and agitation. The patient is not nervous/anxious.        Objective:   Physical Exam  Constitutional: She appears well-developed and well-nourished. No distress.  Abdominal: There is no tenderness.          Assessment & Plan:   Acute UTI.  Will treat with ciprofloxacin for 5 days

## 2013-11-08 NOTE — Patient Instructions (Signed)
Take your antibiotic as prescribed until ALL of it is gone, but stop if you develop a rash, swelling, or any side effects of the medication.  Contact our office as soon as possible if  there are side effects of the medication. 

## 2013-11-14 ENCOUNTER — Telehealth: Payer: Self-pay | Admitting: Internal Medicine

## 2013-11-14 ENCOUNTER — Telehealth: Payer: Self-pay

## 2013-11-14 ENCOUNTER — Encounter: Payer: BC Managed Care – PPO | Admitting: Family Medicine

## 2013-11-14 MED ORDER — NITROFURANTOIN MONOHYD MACRO 100 MG PO CAPS: 100.0000 mg | ORAL_CAPSULE | Freq: Two times a day (BID) | ORAL | Status: DC

## 2013-11-14 MED ORDER — AMOXICILLIN 250 MG PO CAPS: 250.0000 mg | ORAL_CAPSULE | Freq: Three times a day (TID) | ORAL | Status: DC

## 2013-11-14 NOTE — Telephone Encounter (Signed)
Pt had an appointment today with Dr. Selena BattenKim and cancelled. Please advise

## 2013-11-14 NOTE — Telephone Encounter (Signed)
Spoke to pt told her Rx for Amoxicillin 250 mg one tablet three times a day with meals sent to pharmacy. Pt verbalized understanding.

## 2013-11-14 NOTE — Telephone Encounter (Signed)
Spoke to pt told her new Rx sent to pharmacy for Macrobid. Pt verbalized understanding.

## 2013-11-14 NOTE — Telephone Encounter (Signed)
Pt req Dr Kirtland BouchardK call in her another RX  for her UTI

## 2013-11-14 NOTE — Progress Notes (Signed)
error    This encounter was created in error - please disregard.

## 2013-11-14 NOTE — Telephone Encounter (Signed)
Pt is allergic to penicillins, an amoxicillin was called in earlier to pharmacy. Please call in something different for patient.  Thanks

## 2013-11-14 NOTE — Telephone Encounter (Signed)
Amoxicillin 250 #21, one 3 times a day with meals

## 2013-11-14 NOTE — Telephone Encounter (Signed)
Please see message and advise 

## 2013-11-14 NOTE — Telephone Encounter (Signed)
Discussed allergy to PCN with Dr. Kirtland BouchardK, he said to cancel order for Amoxicillin and order Macrobid 100 mg po BID x 7 days, disp # 14.

## 2014-06-11 IMAGING — CR DG ANKLE COMPLETE 3+V*R*
3 series · 3 of 3 positions shown · non-contrast
Comparison: None.

CLINICAL DATA: Injury with right ankle and foot pain and lateral
swelling.

RIGHT ANKLE - COMPLETE 3+ VIEW

[view not recorded (1 of 3)]
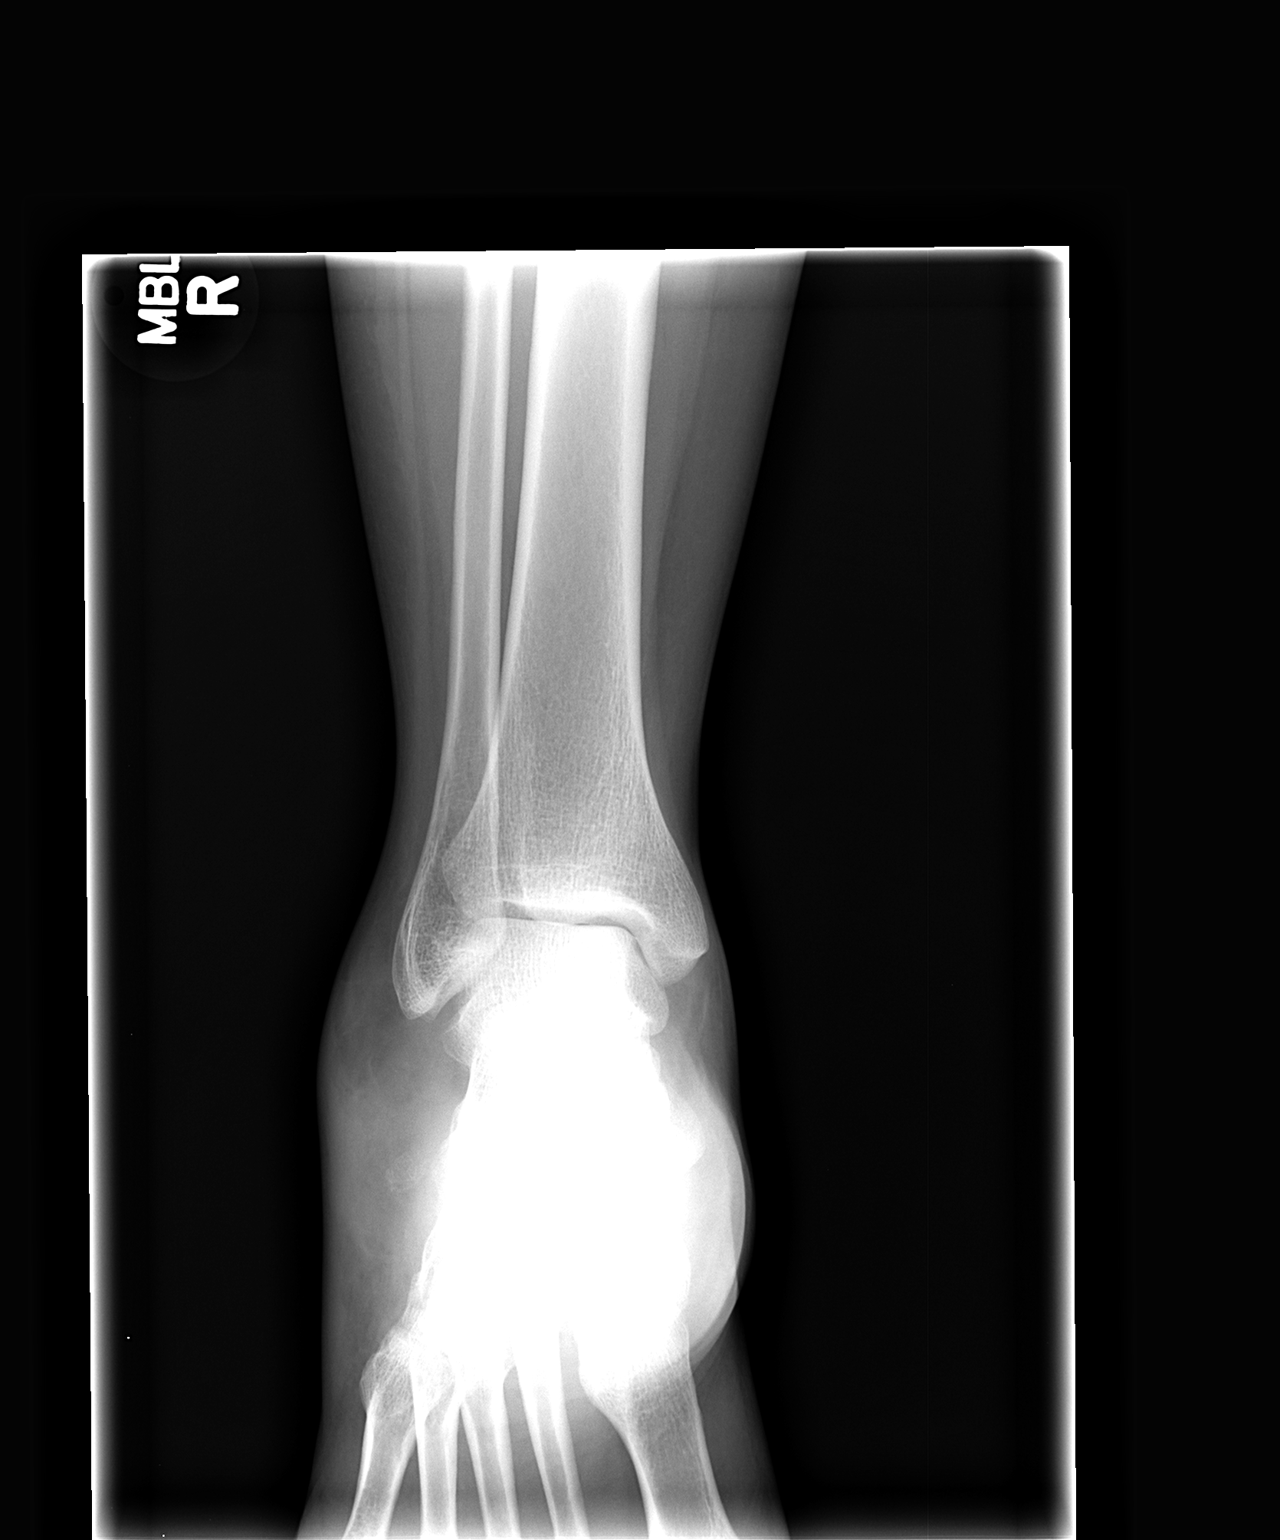

[view not recorded (2 of 3)]
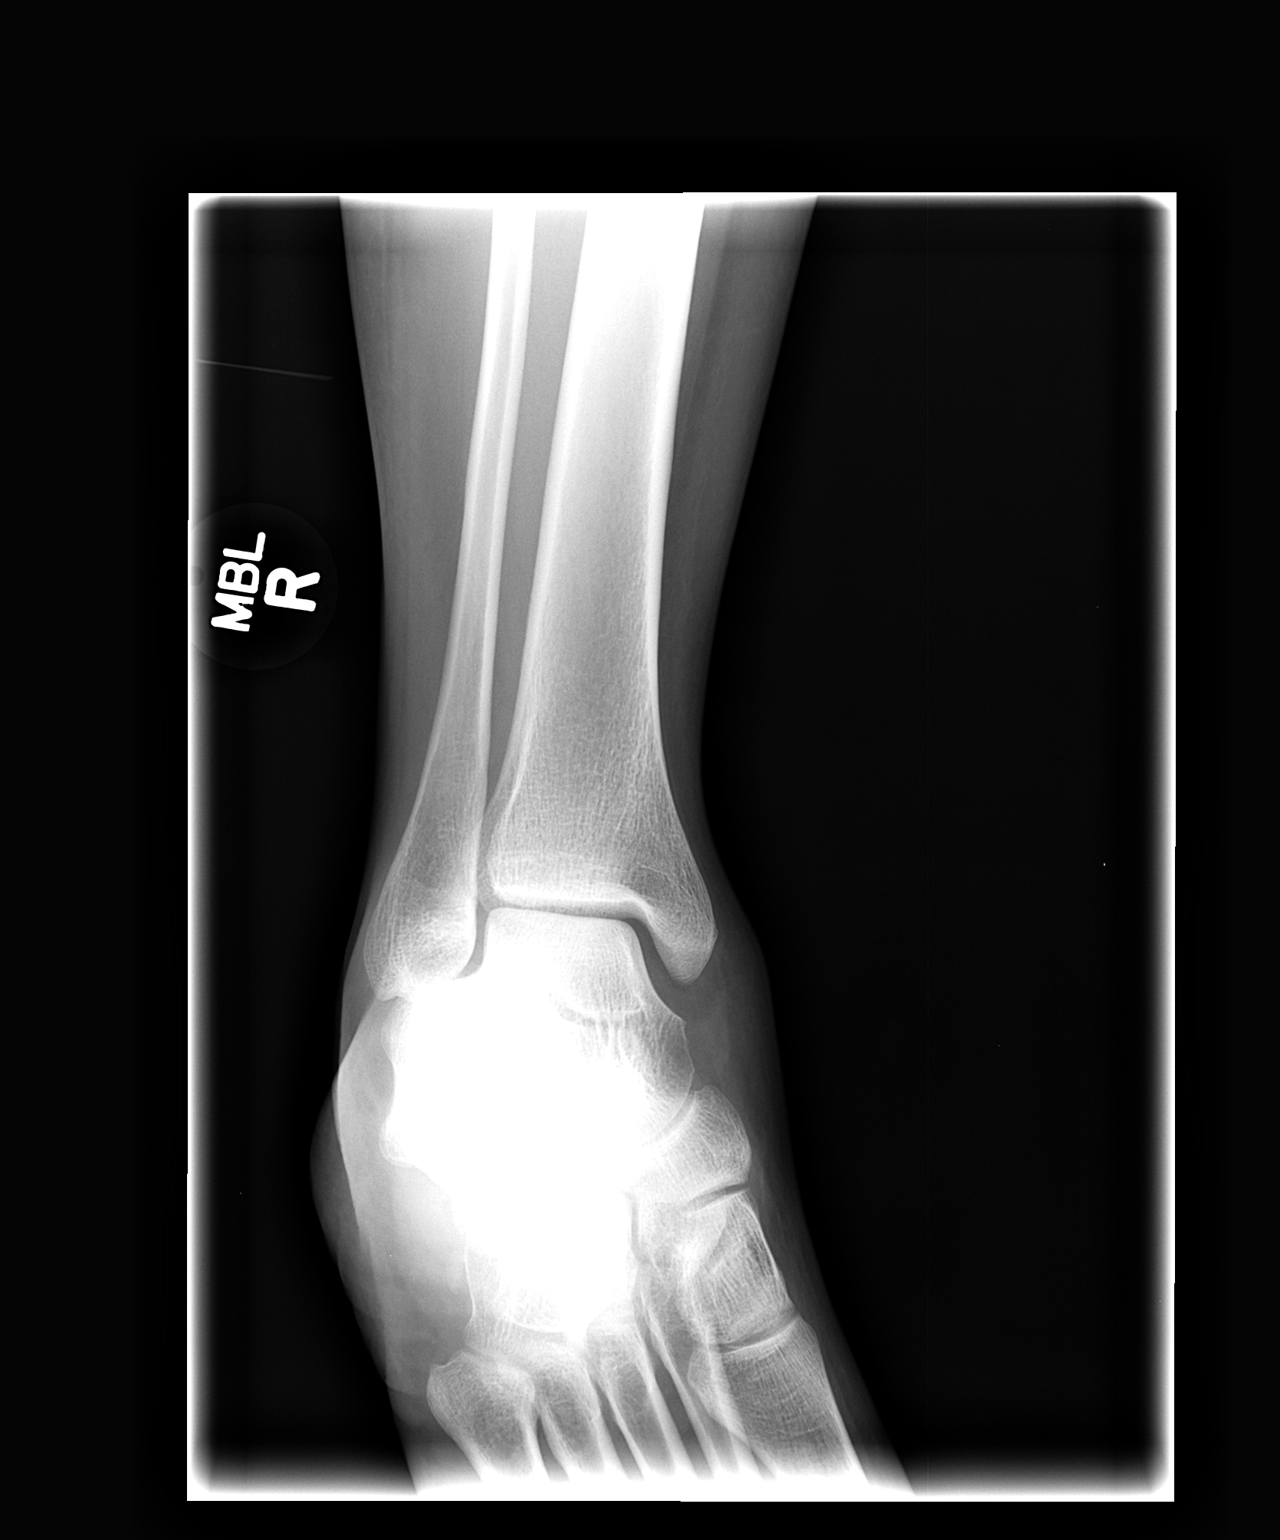

[view not recorded (3 of 3)]
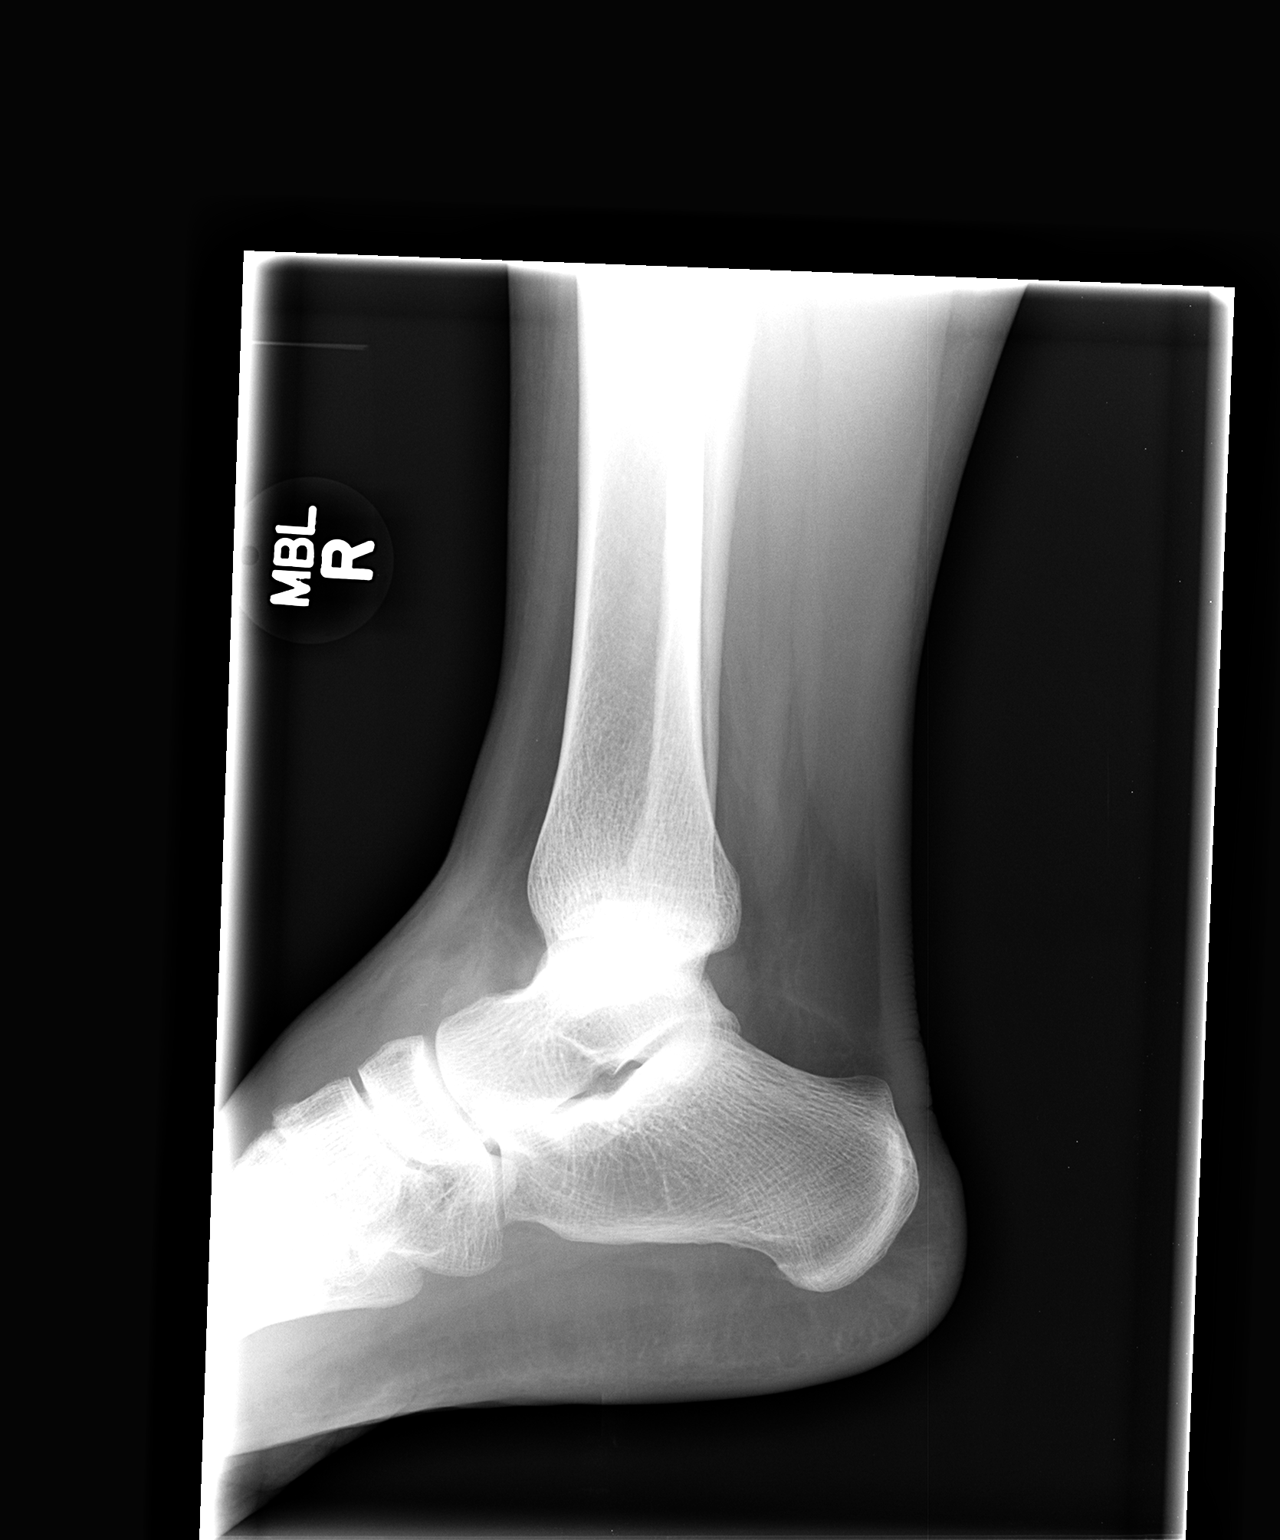

[3 of 3 positions shown; findings below may reference images not displayed]

FINDINGS: Extensive lateral soft tissue swelling is identified.
There is a bone fragment off the lateral margin of the distal
calcaneus likely due to the extensor digitorum brevis avulsion
fracture.
IMPRESSION: Findings most consistent with an avulsion fracture of the origin of
the extensor digitorum brevis.

## 2014-06-11 IMAGING — CR DG FOOT 2V*R*
2 series · 2 of 2 positions shown · non-contrast
Comparison: None.

CLINICAL DATA: Injury.  Lateral right foot pain and swelling.

RIGHT FOOT - 2 VIEW

[view not recorded (1 of 2)]
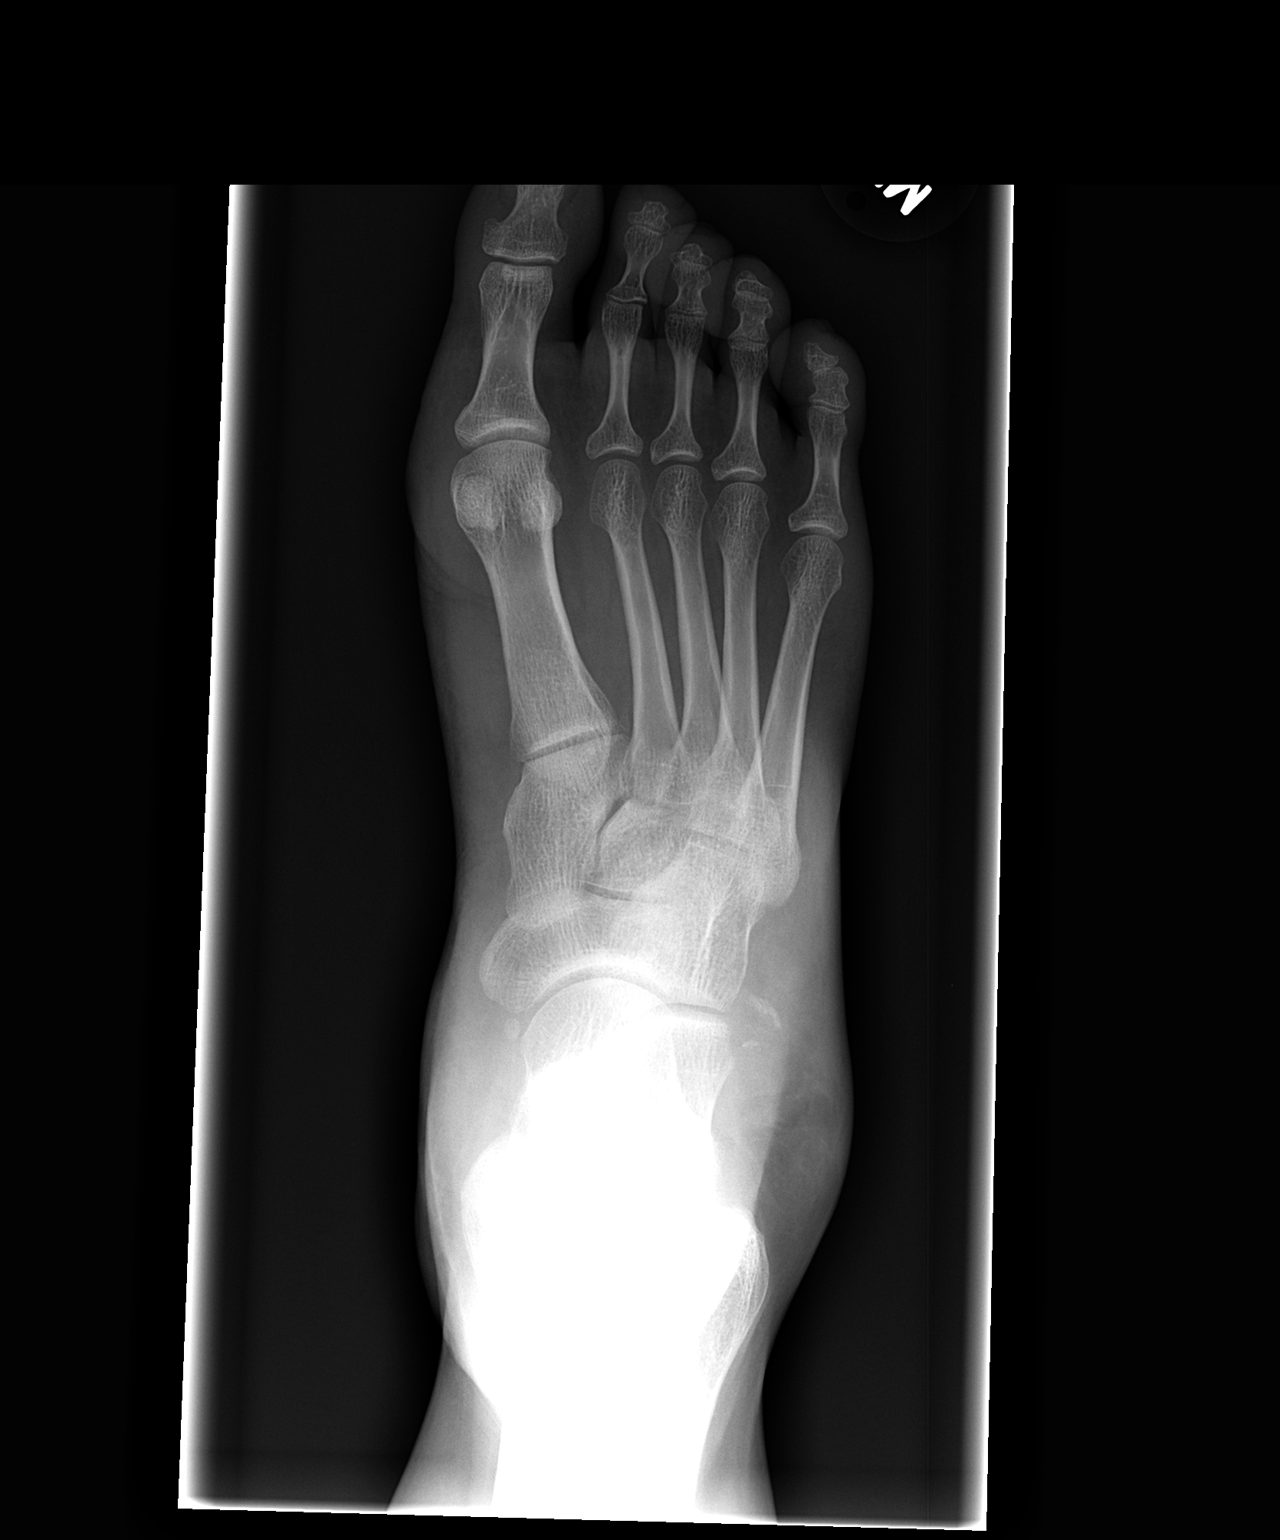

[view not recorded (2 of 2)]
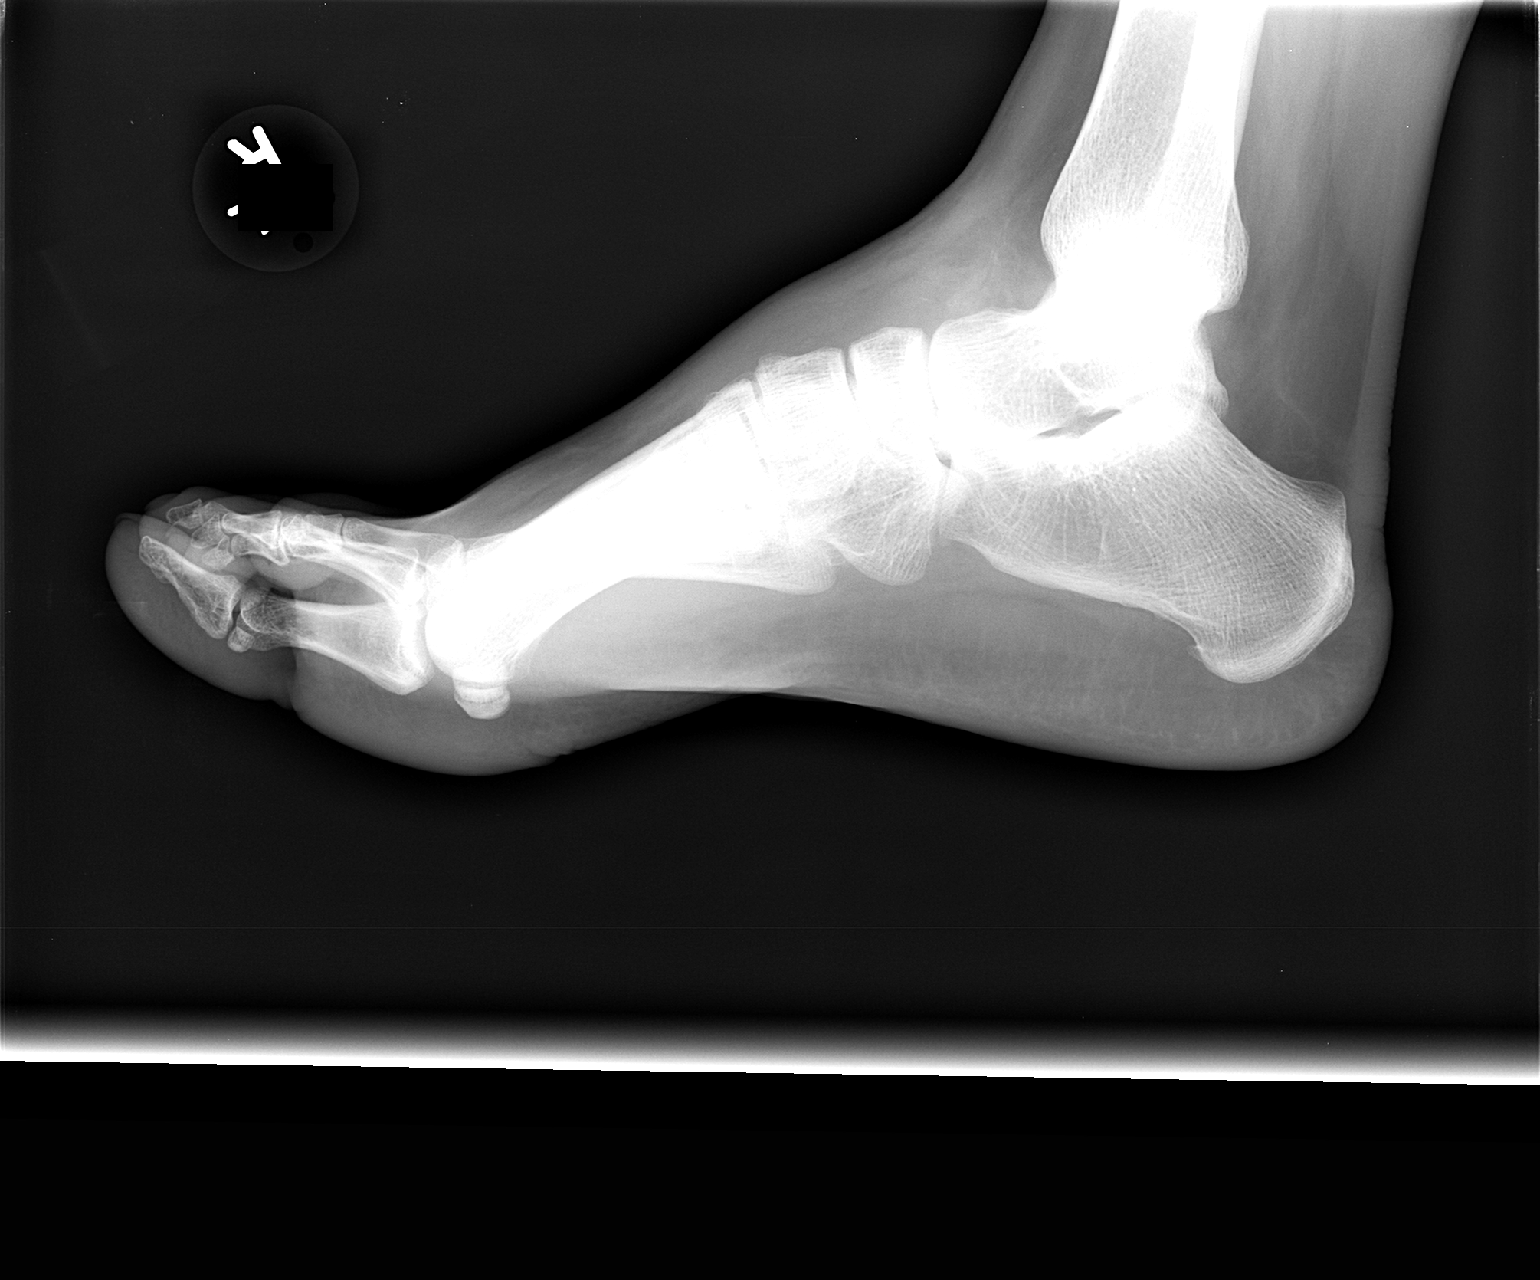

[2 of 2 positions shown; findings below may reference images not displayed]

FINDINGS: A bony fragment is seen off the distal margin of the
calcaneus on the lateral side most consistent with an avulsion
fracture at the origin of the extensor digitorum brevis.  Overlying
soft tissue swelling is noted.  No other acute abnormality is
identified.
IMPRESSION: Avulsion fracture of the distal calcaneus at the origin of the
extensor digitorum brevis.  Associated soft tissue swelling noted.

## 2014-08-09 ENCOUNTER — Encounter: Payer: Self-pay | Admitting: Family Medicine

## 2014-08-09 ENCOUNTER — Ambulatory Visit (INDEPENDENT_AMBULATORY_CARE_PROVIDER_SITE_OTHER): Payer: BC Managed Care – PPO | Admitting: Family Medicine

## 2014-08-09 VITALS — BP 110/82 | Temp 98.6°F | Wt 228.0 lb

## 2014-08-09 DIAGNOSIS — R350 Frequency of micturition: Secondary | ICD-10-CM

## 2014-08-09 DIAGNOSIS — N3000 Acute cystitis without hematuria: Secondary | ICD-10-CM

## 2014-08-09 LAB — POCT URINALYSIS DIPSTICK
Bilirubin, UA: NEGATIVE
Blood, UA: NEGATIVE
Glucose, UA: NEGATIVE
Ketones, UA: NEGATIVE
Leukocytes, UA: NEGATIVE
Nitrite, UA: POSITIVE
Protein, UA: NEGATIVE
Spec Grav, UA: 1.015
Urobilinogen, UA: 0.2
pH, UA: 7

## 2014-08-09 MED ORDER — CIPROFLOXACIN HCL 250 MG PO TABS: 250.0000 mg | ORAL_TABLET | Freq: Two times a day (BID) | ORAL | Status: DC

## 2014-08-09 NOTE — Patient Instructions (Signed)
Cipro twice a day for 3 days. Send urine for culture given trouble clearing last infection vs. Possible kidney stone  Urinary Tract Infection Urinary tract infections (UTIs) can develop anywhere along your urinary tract. Your urinary tract is your body's drainage system for removing wastes and extra water. Your urinary tract includes two kidneys, two ureters, a bladder, and a urethra. Your kidneys are a pair of bean-shaped organs. Each kidney is about the size of your fist. They are located below your ribs, one on each side of your spine. CAUSES Infections are caused by microbes, which are microscopic organisms, including fungi, viruses, and bacteria. These organisms are so small that they can only be seen through a microscope. Bacteria are the microbes that most commonly cause UTIs. SYMPTOMS  Symptoms of UTIs may vary by age and gender of the patient and by the location of the infection. Symptoms in young women typically include a frequent and intense urge to urinate and a painful, burning feeling in the bladder or urethra during urination. Older women and men are more likely to be tired, shaky, and weak and have muscle aches and abdominal pain. A fever may mean the infection is in your kidneys. Other symptoms of a kidney infection include pain in your back or sides below the ribs, nausea, and vomiting. DIAGNOSIS To diagnose a UTI, your caregiver will ask you about your symptoms. Your caregiver also will ask to provide a urine sample. The urine sample will be tested for bacteria and white blood cells. White blood cells are made by your body to help fight infection. TREATMENT  Typically, UTIs can be treated with medication. Because most UTIs are caused by a bacterial infection, they usually can be treated with the use of antibiotics. The choice of antibiotic and length of treatment depend on your symptoms and the type of bacteria causing your infection. HOME CARE INSTRUCTIONS  If you were prescribed  antibiotics, take them exactly as your caregiver instructs you. Finish the medication even if you feel better after you have only taken some of the medication.  Drink enough water and fluids to keep your urine clear or pale yellow.  Avoid caffeine, tea, and carbonated beverages. They tend to irritate your bladder.  Empty your bladder often. Avoid holding urine for long periods of time.  Empty your bladder before and after sexual intercourse.  After a bowel movement, women should cleanse from front to back. Use each tissue only once. SEEK MEDICAL CARE IF:   You have back pain.  You develop a fever.  Your symptoms do not begin to resolve within 3 days. SEEK IMMEDIATE MEDICAL CARE IF:   You have severe back pain or lower abdominal pain.  You develop chills.  You have nausea or vomiting.  You have continued burning or discomfort with urination. MAKE SURE YOU:   Understand these instructions.  Will watch your condition.  Will get help right away if you are not doing well or get worse. Document Released: 05/21/2005 Document Revised: 02/10/2012 Document Reviewed: 09/19/2011 [facility] Patient Information 2015 [place], [place]. This information is not intended to replace advice given to you by your health care provider. Make sure you discuss any questions you have with your health care provider.

## 2014-08-09 NOTE — Progress Notes (Signed)
  Tana ConchStephen Izreal Kock, MD Phone: [phone]  Subjective:   Amanda Petersen is a 43 y.o. year old very pleasant female patient who presents with the following:  Dysuria/polyuria -mild flank aching a few days ago. 2 days ago started with: Suprapubic pressure. Frequent urination. Mild dysuria.Took azo without relief. Continues to worsen. No other treatments tried.   ROS-no vaginal dishcharge. no fevers, chills, fatigue/malaise, nausea/vomiting, or recent weight change  Past Medical History-March had similar issue-1st antibiotic did not work, had recurrence and then went to urology and they thought perhaps a kidney stone. Pain was more significant last time. Polyuria was worst last time too. Also has history of cellulitis  Medications- reviewed and updated Current Outpatient Prescriptions  Medication Sig Dispense Refill  . ciprofloxacin (CIPRO) 250 MG tablet Take 1 tablet (250 mg total) by mouth 2 (two) times daily. 6 tablet 0  . ibuprofen (ADVIL,MOTRIN) 200 MG tablet Take 200 mg by mouth every 6 (six) hours as needed.     No current facility-administered medications for this visit.    Objective: BP 110/82 mmHg  Temp(Src) 98.6 F (37 C)  Wt 228 lb (103.42 kg) Gen: NAD, resting comfortably on table CV: RRR no murmurs rubs or gallops Lungs: CTAB no crackles, wheeze, rhonchi Abdomen: no suprapubic pain. No rebound/guarding. No abdominal pain otherwise No cva tenderness Ext: no edema   Results for orders placed or performed in visit on 08/09/14 (from the past 24 hour(s))  POC Urinalysis Dipstick     Status: None   Collection Time: 08/09/14  2:22 PM  Result Value Ref Range   Color, UA yellow    Clarity, UA clear    Glucose, UA n    Bilirubin, UA n    Ketones, UA n    Spec Grav, UA 1.015    Blood, UA n    pH, UA 7.0    Protein, UA n    Urobilinogen, UA 0.2    Nitrite, UA positive    Leukocytes, UA Negative    Assessment/Plan:  UTI Hives with pcn and was possibly resistant to  last nitrofurantoin treatment. Treat with cipro x 3 days. Send for culture given difficulty with clearance last time. Return precautions discussed.   After visit, reviewed scanned notes from urology and appears that she had an extensive workup including CT and cystoscopy and was thought to have frequent urination and possibly overactive bladder-plan was for 4 weeks of myrbetriq and follow up. If symptoms persist or UTI negative on culture, could refer back to urology. No clear evidence of nephrolithiasis.   Orders Placed This Encounter  Procedures  . Urine culture    [facility]  . POC Urinalysis Dipstick   Meds ordered this encounter  Medications  . ciprofloxacin (CIPRO) 250 MG tablet    Sig: Take 1 tablet (250 mg total) by mouth 2 (two) times daily.    Dispense:  6 tablet    Refill:  0

## 2014-08-10 ENCOUNTER — Telehealth: Payer: Self-pay | Admitting: Internal Medicine

## 2014-08-10 MED ORDER — NITROFURANTOIN MONOHYD MACRO 100 MG PO CAPS: 100.0000 mg | ORAL_CAPSULE | Freq: Two times a day (BID) | ORAL | Status: DC

## 2014-08-10 NOTE — Telephone Encounter (Signed)
5 Days of medicine she was on last time. Still awaiting culture-hopeful infection sensitive to this antibiotic.

## 2014-08-10 NOTE — Telephone Encounter (Signed)
LM on pt VM making her aware that [facility]

## 2014-08-10 NOTE — Telephone Encounter (Signed)
See below

## 2014-08-10 NOTE — Telephone Encounter (Signed)
Patient states ciprofloxacin (CIPRO) 250 MG tablet is causing nausea and watery stools.  Patient would like to know if Dr. Durene CalHunter can send her in a different anti-biotic to [id number], [place] [address]

## 2014-08-11 LAB — URINE CULTURE
Colony Count: NO GROWTH
Organism ID, Bacteria: NO GROWTH

## 2014-10-11 ENCOUNTER — Other Ambulatory Visit: Payer: BC Managed Care – PPO

## 2014-10-16 ENCOUNTER — Encounter: Payer: BC Managed Care – PPO | Admitting: Internal Medicine

## 2014-12-12 ENCOUNTER — Other Ambulatory Visit: Payer: Self-pay

## 2014-12-19 ENCOUNTER — Encounter: Payer: Self-pay | Admitting: Internal Medicine

## 2015-01-30 ENCOUNTER — Other Ambulatory Visit: Payer: Self-pay

## 2015-02-08 ENCOUNTER — Encounter: Payer: Self-pay | Admitting: Internal Medicine

## 2015-03-05 ENCOUNTER — Other Ambulatory Visit (INDEPENDENT_AMBULATORY_CARE_PROVIDER_SITE_OTHER): Payer: BC Managed Care – PPO

## 2015-03-05 DIAGNOSIS — E348 Other specified endocrine disorders: Secondary | ICD-10-CM | POA: Diagnosis not present

## 2015-03-05 DIAGNOSIS — Z0000 Encounter for general adult medical examination without abnormal findings: Secondary | ICD-10-CM

## 2015-03-05 DIAGNOSIS — IMO0002 Reserved for concepts with insufficient information to code with codable children: Secondary | ICD-10-CM

## 2015-03-05 LAB — CBC WITH DIFFERENTIAL/PLATELET
Basophils Absolute: 0 10*3/uL (ref 0.0–0.1)
Basophils Relative: 0.4 % (ref 0.0–3.0)
Eosinophils Absolute: 0.1 10*3/uL (ref 0.0–0.7)
Eosinophils Relative: 2.1 % (ref 0.0–5.0)
HCT: 42.7 % (ref 36.0–46.0)
Hemoglobin: 14 g/dL (ref 12.0–15.0)
Lymphocytes Relative: 41.9 % (ref 12.0–46.0)
Lymphs Abs: 2.4 10*3/uL (ref 0.7–4.0)
MCHC: 32.8 g/dL (ref 30.0–36.0)
MCV: 86.8 fl (ref 78.0–100.0)
Monocytes Absolute: 0.3 10*3/uL (ref 0.1–1.0)
Monocytes Relative: 5.6 % (ref 3.0–12.0)
Neutro Abs: 2.8 10*3/uL (ref 1.4–7.7)
Neutrophils Relative %: 50 % (ref 43.0–77.0)
Platelets: 238 10*3/uL (ref 150.0–400.0)
RBC: 4.93 Mil/uL (ref 3.87–5.11)
RDW: 14.8 % (ref 11.5–15.5)
WBC: 5.7 10*3/uL (ref 4.0–10.5)

## 2015-03-05 LAB — LIPID PANEL
Cholesterol: 203 mg/dL — ABNORMAL HIGH (ref 0–200)
HDL: 57.8 mg/dL (ref >39.00)
LDL Cholesterol: 127 mg/dL — ABNORMAL HIGH (ref 0–99)
NonHDL: 145.2
Total CHOL/HDL Ratio: 4
Triglycerides: 93 mg/dL (ref 0.0–149.0)
VLDL: 18.6 mg/dL (ref 0.0–40.0)

## 2015-03-05 LAB — HEPATIC FUNCTION PANEL
ALT: 14 U/L (ref 0–35)
AST: 15 U/L (ref 0–37)
Albumin: 4.4 g/dL (ref 3.5–5.2)
Alkaline Phosphatase: 113 U/L (ref 39–117)
Bilirubin, Direct: 0.1 mg/dL (ref 0.0–0.3)
Total Bilirubin: 0.5 mg/dL (ref 0.2–1.2)
Total Protein: 8 g/dL (ref 6.0–8.3)

## 2015-03-05 LAB — POCT URINALYSIS DIPSTICK
Bilirubin, UA: NEGATIVE
Blood, UA: NEGATIVE
Glucose, UA: NEGATIVE
Ketones, UA: NEGATIVE
Leukocytes, UA: NEGATIVE
Nitrite, UA: NEGATIVE
Protein, UA: NEGATIVE
Spec Grav, UA: 1.02
Urobilinogen, UA: 1
pH, UA: 7.5

## 2015-03-05 LAB — BASIC METABOLIC PANEL
BUN: 9 mg/dL (ref 6–23)
CO2: 27 mEq/L (ref 19–32)
Calcium: 9.7 mg/dL (ref 8.4–10.5)
Chloride: 106 mEq/L (ref 96–112)
Creatinine, Ser: 0.95 mg/dL (ref 0.40–1.20)
GFR: 82.33 mL/min (ref >60.00)
Glucose, Bld: 82 mg/dL (ref 70–99)
Potassium: 3.6 mEq/L (ref 3.5–5.1)
Sodium: 141 mEq/L (ref 135–145)

## 2015-03-05 LAB — TSH: TSH: 2.63 u[IU]/mL (ref 0.35–4.50)

## 2015-03-06 LAB — ESTRADIOL: Estradiol: 13.7 pg/mL

## 2015-03-06 LAB — PROGESTERONE: Progesterone: <0.2 ng/mL

## 2015-03-06 LAB — FOLLICLE STIMULATING HORMONE: FSH: 62.1 m[IU]/mL

## 2015-03-12 ENCOUNTER — Ambulatory Visit (INDEPENDENT_AMBULATORY_CARE_PROVIDER_SITE_OTHER): Payer: BC Managed Care – PPO | Admitting: Internal Medicine

## 2015-03-12 ENCOUNTER — Encounter: Payer: Self-pay | Admitting: Internal Medicine

## 2015-03-12 VITALS — BP 130/80 | HR 103 | Temp 99.2°F | Resp 20 | Ht 66.25 in | Wt 229.0 lb

## 2015-03-12 DIAGNOSIS — Z0000 Encounter for general adult medical examination without abnormal findings: Secondary | ICD-10-CM | POA: Diagnosis not present

## 2015-03-12 NOTE — Progress Notes (Signed)
Subjective:    Patient ID: Amanda Petersen, female    DOB: March 28, 1971, 44 y.o.   MRN: [id number]  HPI 44 year old patient who is seen today for a preventive health examination.  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 Doing quite well without concerns or complaints Takes no chronic medications  No past medical history on file.  History   Social History  . Marital Status: Divorced    Spouse Name: N/A  . Number of Children: N/A  . Years of Education: N/A   Occupational History  . Not on file.   Social History Main Topics  . Smoking status: Never Smoker   . Smokeless tobacco: Never Used  . Alcohol Use: No  . Drug Use: No  . Sexual Activity: Not on file   Other Topics Concern  . Not on file   Social History Narrative    Past Surgical History  Procedure Laterality Date  . Abdominal hysterectomy      No family history on file.  Allergies  Allergen Reactions  . Penicillins     hives    Current Outpatient Prescriptions on File Prior to Visit  Medication Sig Dispense Refill  . ibuprofen (ADVIL,MOTRIN) 200 MG tablet Take 200 mg by mouth every 6 (six) hours as needed.     No current facility-administered medications on file prior to visit.    BP 130/80 mmHg  Pulse 103  Temp(Src) 99.2 F (37.3 C) (Oral)  Resp 20  Ht 5' 6.25" (1.683 m)  Wt 229 lb (103.874 kg)  BMI 36.67 kg/m2  SpO2 97%      Review of Systems  Constitutional: Negative for fever, appetite change, fatigue and unexpected weight change.  HENT: Negative for congestion, dental problem, ear pain, hearing loss, mouth sores, nosebleeds, sinus pressure, sore throat, tinnitus, trouble swallowing and voice change.   Eyes: Negative for photophobia, pain, redness and visual disturbance.  Respiratory: Negative for cough, chest tightness and shortness of breath.   Cardiovascular: Negative for chest pain, palpitations and leg swelling.  Gastrointestinal: Negative  for nausea, vomiting, abdominal pain, diarrhea, constipation, blood in stool, abdominal distention and rectal pain.  Genitourinary: Negative for dysuria, urgency, frequency, hematuria, flank pain, vaginal bleeding, vaginal discharge, difficulty urinating, genital sores, vaginal pain, menstrual problem and pelvic pain.  Musculoskeletal: Negative for back pain, arthralgias and neck stiffness.  Skin: Negative for rash.  Neurological: Negative for dizziness, syncope, speech difficulty, weakness, light-headedness, numbness and headaches.  Hematological: Negative for adenopathy. Does not bruise/bleed easily.  Psychiatric/Behavioral: Negative for suicidal ideas, behavioral problems, self-injury, dysphoric mood and agitation. The patient is not nervous/anxious.        Objective:   Physical Exam  Constitutional: She is oriented to person, place, and time. She appears well-developed and well-nourished.  HENT:  Head: Normocephalic and atraumatic.  Right Ear: External ear normal.  Left Ear: External ear normal.  Mouth/Throat: Oropharynx is clear and moist.  Eyes: Conjunctivae and EOM are normal.  Neck: Normal range of motion. Neck supple. No JVD present. No thyromegaly present.  Cardiovascular: Normal rate, regular rhythm, normal heart sounds and intact distal pulses.   No murmur heard. Pulmonary/Chest: Effort normal and breath sounds normal. She has no wheezes. She has no rales.  Abdominal: Soft. Bowel sounds are normal. She exhibits no distension and no mass. There is no tenderness. There is no rebound and no guarding.  Musculoskeletal: Normal range of motion. She exhibits no edema or tenderness.  Neurological: She  is alert and oriented to person, place, and time. She has normal reflexes. No cranial nerve deficit. She exhibits normal muscle tone. Coordination normal.  Skin: Skin is warm and dry. No rash noted.  Psychiatric: She has a normal mood and affect. Her behavior is normal.            Assessment & Plan:   Preventive health Surgical menopause.  Will check a baseline bone density study Mild obesity.  Continue efforts at weight loss.  She does go to a health club 3 times per week  Return in one or 2 years for follow-up

## 2015-03-12 NOTE — Progress Notes (Signed)
Pre visit review using our clinic review tool, if applicable. No additional management support is needed unless otherwise documented below in the visit note. 

## 2015-03-12 NOTE — Patient Instructions (Signed)
Take a calcium supplement, plus [phone] units of vitamin D    It is important that you exercise regularly, at least 20 minutes 3 to 4 times per week.  If you develop chest pain or shortness of breath seek  medical attention.  Health Maintenance Adopting a healthy lifestyle and getting preventive care can go a long way to promote health and wellness. Talk with your health care provider about what schedule of regular examinations is right for you. This is a good chance for you to check in with your provider about disease prevention and staying healthy. In between checkups, there are plenty of things you can do on your own. Experts have done a lot of research about which lifestyle changes and preventive measures are most likely to keep you healthy. Ask your health care provider for more information. WEIGHT AND DIET  Eat a healthy diet  Be sure to include plenty of vegetables, fruits, low-fat dairy products, and lean protein.  Do not eat a lot of foods high in solid fats, added sugars, or salt.  Get regular exercise. This is one of the most important things you can do for your health.  Most adults should exercise for at least 150 minutes each week. The exercise should increase your heart rate and make you sweat (moderate-intensity exercise).  Most adults should also do strengthening exercises at least twice a week. This is in addition to the moderate-intensity exercise.  Maintain a healthy weight  Body mass index (BMI) is a measurement that can be used to identify possible weight problems. It estimates body fat based on height and weight. Your health care provider can help determine your BMI and help you achieve or maintain a healthy weight.  For females 84 years of age and older:   A BMI below 18.5 is considered underweight.  A BMI of 18.5 to 24.9 is normal.  A BMI of 25 to 29.9 is considered overweight.  A BMI of 30 and above is considered obese.  Watch levels of cholesterol and  blood lipids  You should start having your blood tested for lipids and cholesterol at 44 years of age, then have this test every 5 years.  You may need to have your cholesterol levels checked more often if:  Your lipid or cholesterol levels are high.  You are older than 44 years of age.  You are at high risk for heart disease.  CANCER SCREENING   Lung Cancer  Lung cancer screening is recommended for adults 44-30 years old who are at high risk for lung cancer because of a history of smoking.  A yearly low-dose CT scan of the lungs is recommended for people who:  Currently smoke.  Have quit within the past 15 years.  Have at least a 30-pack-year history of smoking. A pack year is smoking an average of one pack of cigarettes a day for 1 year.  Yearly screening should continue until it has been 15 years since you quit.  Yearly screening should stop if you develop a health problem that would prevent you from having lung cancer treatment.  Breast Cancer  Practice breast self-awareness. This means understanding how your breasts normally appear and feel.  It also means doing regular breast self-exams. Let your health care provider know about any changes, no matter how small.  If you are in your 20s or 30s, you should have a clinical breast exam (CBE) by a health care provider every 1-3 years as part of a regular health  exam.  If you are 40 or older, have a CBE every year. Also consider having a breast X-ray (mammogram) every year.  If you have a family history of breast cancer, talk to your health care provider about genetic screening.  If you are at high risk for breast cancer, talk to your health care provider about having an MRI and a mammogram every year.  Breast cancer gene (BRCA) assessment is recommended for women who have family members with BRCA-related cancers. BRCA-related cancers include:  Breast.  Ovarian.  Tubal.  Peritoneal cancers.  Results of the  assessment will determine the need for genetic counseling and BRCA1 and BRCA2 testing. Cervical Cancer Routine pelvic examinations to screen for cervical cancer are no longer recommended for nonpregnant women who are considered low risk for cancer of the pelvic organs (ovaries, uterus, and vagina) and who do not have symptoms. A pelvic examination may be necessary if you have symptoms including those associated with pelvic infections. Ask your health care provider if a screening pelvic exam is right for you.   The Pap test is the screening test for cervical cancer for women who are considered at risk.  If you had a hysterectomy for a problem that was not cancer or a condition that could lead to cancer, then you no longer need Pap tests.  If you are older than 65 years, and you have had normal Pap tests for the past 10 years, you no longer need to have Pap tests.  If you have had past treatment for cervical cancer or a condition that could lead to cancer, you need Pap tests and screening for cancer for at least 20 years after your treatment.  If you no longer get a Pap test, assess your risk factors if they change (such as having a new sexual partner). This can affect whether you should start being screened again.  Some women have medical problems that increase their chance of getting cervical cancer. If this is the case for you, your health care provider may recommend more frequent screening and Pap tests.  The human papillomavirus (HPV) test is another test that may be used for cervical cancer screening. The HPV test looks for the virus that can cause cell changes in the cervix. The cells collected during the Pap test can be tested for HPV.  The HPV test can be used to screen women 72 years of age and older. Getting tested for HPV can extend the interval between normal Pap tests from three to five years. 
  After 44 years of age, women should have HPV testing as often as Pap tests.  An HPV test also should be used to screen women of any age who have unclear Pap test  results.  After 44 years of age, women should have HPV testing as often as Pap tests.  Colorectal Cancer  This type of cancer can be detected and often prevented.  Routine colorectal cancer screening usually begins at 44 years of age and continues through 44 years of age.  Your health care provider may recommend screening at an earlier age if you have risk factors for colon cancer.  Your health care provider may also recommend using home test kits to check for hidden blood in the stool.  A small camera at the end of a tube can be used to examine your colon directly (sigmoidoscopy or colonoscopy). This is done to check for the earliest forms of colorectal cancer.  Routine screening usually begins at age 9.  Direct examination of the colon should be repeated every 5-10 years through 44 years of  age. However, you may need to be screened more often if early forms of precancerous polyps or small growths are found. Skin Cancer  Check your skin from head to toe regularly.  Tell your health care provider about any new moles or changes in moles, especially if there is a change in a mole's shape or color.  Also tell your health care provider if you have a mole that is larger than the size of a pencil eraser.  Always use sunscreen. Apply sunscreen liberally and repeatedly throughout the day.  Protect yourself by wearing long sleeves, pants, a wide-brimmed hat, and sunglasses whenever you are outside. HEART DISEASE, DIABETES, AND HIGH BLOOD PRESSURE   Have your blood pressure checked at least every 1-2 years. High blood pressure causes heart disease and increases the risk of stroke.  If you are between 98 years and 20 years old, ask your health care provider if you should take aspirin to prevent strokes.  Have regular diabetes screenings. This involves taking a blood sample to check your fasting blood sugar level.  If you are at a normal weight and have a low risk for diabetes, have this  test once every three years after 44 years of age.  If you are overweight and have a high risk for diabetes, consider being tested at a younger age or more often. PREVENTING INFECTION  Hepatitis B  If you have a higher risk for hepatitis B, you should be screened for this virus. You are considered at high risk for hepatitis B if:  You were born in a country where hepatitis B is common. Ask your health care provider which countries are considered high risk.  Your parents were born in a high-risk country, and you have not been immunized against hepatitis B (hepatitis B vaccine).  You have HIV or AIDS.  You use needles to inject street drugs.  You live with someone who has hepatitis B.  You have had sex with someone who has hepatitis B.  You get hemodialysis treatment.  You take certain medicines for conditions, including cancer, organ transplantation, and autoimmune conditions. Hepatitis C  Blood testing is recommended for:  Everyone born from [birth year] through 1965.  Anyone with known risk factors for hepatitis C. Sexually transmitted infections (STIs)  You should be screened for sexually transmitted infections (STIs) including gonorrhea and chlamydia if:  You are sexually active and are younger than 44 years of age.  You are older than 44 years of age and your health care provider tells you that you are at risk for this type of infection.  Your sexual activity has changed since you were last screened and you are at an increased risk for chlamydia or gonorrhea. Ask your health care provider if you are at risk.  If you do not have HIV, but are at risk, it may be recommended that you take a prescription medicine daily to prevent HIV infection. This is called pre-exposure prophylaxis (PrEP). You are considered at risk if:  You are sexually active and do not regularly use condoms or know the HIV status of your partner(s).  You take drugs by injection.  You are sexually active with  a partner who has HIV. Talk with your health care provider about whether you are at high risk of being infected with HIV. If you choose to begin PrEP, you should first be tested for HIV. You should then be tested every 3 months for as long as you are taking PrEP.  PREGNANCY  If you are premenopausal and you may become pregnant, ask your health care provider about preconception counseling.  If you may become pregnant, take 400 to 800 micrograms (mcg) of folic acid every day.  If you want to prevent pregnancy, talk to your health care provider about birth control (contraception). OSTEOPOROSIS AND MENOPAUSE   Osteoporosis is a disease in which the bones lose minerals and strength with aging. This can result in serious bone fractures. Your risk for osteoporosis can be identified using a bone density scan.  If you are 65 years of age or older, or if you are at risk for osteoporosis and fractures, ask your health care provider if you should be screened.  Ask your health care provider whether you should take a calcium or vitamin D supplement to lower your risk for osteoporosis.  Menopause may have certain physical symptoms and risks.  Hormone replacement therapy may reduce some of these symptoms and risks. Talk to your health care provider about whether hormone replacement therapy is right for you.  HOME CARE INSTRUCTIONS   Schedule regular health, dental, and eye exams.  Stay current with your immunizations.   Do not use any tobacco products including cigarettes, chewing tobacco, or electronic cigarettes.  If you are pregnant, do not drink alcohol.  If you are breastfeeding, limit how much and how often you drink alcohol.  Limit alcohol intake to no more than 1 drink per day for nonpregnant women. One drink equals 12 ounces of beer, 5 ounces of wine, or 1 ounces of hard liquor.  Do not use street drugs.  Do not share needles.  Ask your health care provider for help if you need  support or information about quitting drugs.  Tell your health care provider if you often feel depressed.  Tell your health care provider if you have ever been abused or do not feel safe at home. Document Released: 02/24/2011 Document Revised: 12/26/2013 Document Reviewed: 07/13/2013 [employer] Patient Information 2015 [employer]. This information is not intended to replace advice given to you by your health care provider. Make sure you discuss any questions you have with your health care provider.  

## 2015-03-13 ENCOUNTER — Other Ambulatory Visit: Payer: Self-pay | Admitting: Internal Medicine

## 2015-03-13 DIAGNOSIS — E559 Vitamin D deficiency, unspecified: Secondary | ICD-10-CM

## 2015-03-13 DIAGNOSIS — E58 Dietary calcium deficiency: Secondary | ICD-10-CM

## 2015-03-14 ENCOUNTER — Inpatient Hospital Stay: Admission: RE | Admit: 2015-03-14 | Payer: BC Managed Care – PPO | Source: Ambulatory Visit

## 2015-03-19 ENCOUNTER — Ambulatory Visit
Admission: RE | Admit: 2015-03-19 | Discharge: 2015-03-19 | Disposition: A | Discharge status: Home / Self Care | Payer: BC Managed Care – PPO | Source: Ambulatory Visit | Attending: Internal Medicine | Admitting: Internal Medicine

## 2015-03-19 DIAGNOSIS — E559 Vitamin D deficiency, unspecified: Secondary | ICD-10-CM

## 2015-03-19 DIAGNOSIS — E58 Dietary calcium deficiency: Secondary | ICD-10-CM

## 2015-03-19 LAB — HM DEXA SCAN: HM Dexa Scan: NORMAL

## 2015-03-22 ENCOUNTER — Encounter: Payer: Self-pay | Admitting: Internal Medicine

## 2015-11-30 ENCOUNTER — Encounter: Payer: Self-pay | Admitting: Adult Health

## 2015-11-30 ENCOUNTER — Ambulatory Visit: Payer: BC Managed Care – PPO | Admitting: Family Medicine

## 2015-11-30 ENCOUNTER — Ambulatory Visit (INDEPENDENT_AMBULATORY_CARE_PROVIDER_SITE_OTHER): Payer: BC Managed Care – PPO | Admitting: Adult Health

## 2015-11-30 VITALS — BP 104/74 | Temp 99.0°F | Wt 204.1 lb

## 2015-11-30 DIAGNOSIS — H65192 Other acute nonsuppurative otitis media, left ear: Secondary | ICD-10-CM

## 2015-11-30 MED ORDER — AZITHROMYCIN 250 MG PO TABS: ORAL_TABLET | ORAL | Status: DC

## 2015-11-30 NOTE — Patient Instructions (Addendum)
It was great meeting you today!  Your left ear looks infected.   I have sent in a Z-pack to help with this.   Follow up if no improvement.     Otitis Media With Effusion Otitis media with effusion is the presence of fluid in the middle ear. This is a common problem in children, which often follows ear infections. It may be present for weeks or longer after the infection. Unlike an acute ear infection, otitis media with effusion refers only to fluid behind the ear drum and not infection. Children with repeated ear and sinus infections and allergy problems are the most likely to get otitis media with effusion. CAUSES  The most frequent cause of the fluid buildup is dysfunction of the eustachian tubes. These are the tubes that drain fluid in the ears to the back of the nose (nasopharynx). SYMPTOMS   The main symptom of this condition is hearing loss. As a result, you or your child may:  Listen to the TV at a loud volume.  Not respond to questions.  Ask "what" often when spoken to.  Mistake or confuse one sound or word for another.  There may be a sensation of fullness or pressure but usually not pain. DIAGNOSIS   Your health care provider will diagnose this condition by examining you or your child's ears.  Your health care provider may test the pressure in you or your child's ear with a tympanometer.  A hearing test may be conducted if the problem persists. TREATMENT   Treatment depends on the duration and the effects of the effusion.  Antibiotics, decongestants, nose drops, and cortisone-type drugs (tablets or nasal spray) may not be helpful.  Children with persistent ear effusions may have delayed language or behavioral problems. Children at risk for developmental delays in hearing, learning, and speech may require referral to a specialist earlier than children not at risk.  You or your child's health care provider may suggest a referral to an ear, nose, and throat surgeon  for treatment. The following may help restore normal hearing:  Drainage of fluid.  Placement of ear tubes (tympanostomy tubes).  Removal of adenoids (adenoidectomy). HOME CARE INSTRUCTIONS   Avoid secondhand smoke.  Infants who are breastfed are less likely to have this condition.  Avoid feeding infants while they are lying flat.  Avoid known environmental allergens.  Avoid people who are sick. SEEK MEDICAL CARE IF:   Hearing is not better in 3 months.  Hearing is worse.  Ear pain.  Drainage from the ear.  Dizziness. MAKE SURE YOU:   Understand these instructions.  Will watch your condition.  Will get help right away if you are not doing well or get worse.   This information is not intended to replace advice given to you by your health care provider. Make sure you discuss any questions you have with your health care provider.   Document Released: 09/18/2004 Document Revised: 09/01/2014 Document Reviewed: 03/08/2013 [employer] Interactive Patient Education [employer]

## 2015-11-30 NOTE — Progress Notes (Signed)
   Subjective:    Patient ID: Amanda Petersen, female    DOB: July 31, 1971, 45 y.o.   MRN: [id number]  HPI  45 year old female who presents to the office today for "achy" pain in her left ear, this has been an issue for 4-5 days. She reports headaches but no fevers, sinus pain pressure, or drainage from the ears.    Review of Systems  Constitutional: Negative.   HENT: Positive for ear pain. Negative for ear discharge, hearing loss and tinnitus.   Eyes: Negative.   Respiratory: Negative.   Neurological: Positive for headaches. Negative for dizziness and weakness.  All other systems reviewed and are negative.  No past medical history on file.  Social History   Social History  . Marital Status: Divorced    Spouse Name: N/A  . Number of Children: N/A  . Years of Education: N/A   Occupational History  . Not on file.   Social History Main Topics  . Smoking status: Never Smoker   . Smokeless tobacco: Never Used  . Alcohol Use: No  . Drug Use: No  . Sexual Activity: Not on file   Other Topics Concern  . Not on file   Social History Narrative    Past Surgical History  Procedure Laterality Date  . Abdominal hysterectomy      No family history on file.  Allergies  Allergen Reactions  . Penicillins     hives    Current Outpatient Prescriptions on File Prior to Visit  Medication Sig Dispense Refill  . ibuprofen (ADVIL,MOTRIN) 200 MG tablet Take 200 mg by mouth every 6 (six) hours as needed.    . polyethylene glycol powder (GLYCOLAX/MIRALAX) powder Take 1 Container by mouth once. One capful daily     No current facility-administered medications on file prior to visit.    BP 104/74 mmHg  Temp(Src) 99 F (37.2 C) (Oral)  Wt 204 lb 1.6 oz (92.579 kg)       Objective:   Physical Exam  Constitutional: She appears well-developed and well-nourished. No distress.  HENT:  Head: Normocephalic and atraumatic.  Right Ear: Hearing, tympanic membrane, external ear and  ear canal normal.  Left Ear: Hearing, external ear and ear canal normal. Tympanic membrane is erythematous and bulging.  Nose: Nose normal.  Mouth/Throat: Oropharynx is clear and moist. No oropharyngeal exudate.  Eyes: Conjunctivae and EOM are normal. Pupils are equal, round, and reactive to light. Right eye exhibits no discharge. Left eye exhibits no discharge.  Skin: She is not diaphoretic.  Vitals reviewed.     Assessment & Plan:  1. Acute nonsuppurative otitis media of left ear - azithromycin (ZITHROMAX Z-PAK) 250 MG tablet; Take 2 tablets on Day 1.  Then take 1 tablet daily.  Dispense: 6 tablet; Refill: 0 - Follow up if no improvement.   Amanda Freesory [name], NP

## 2016-04-11 ENCOUNTER — Other Ambulatory Visit (INDEPENDENT_AMBULATORY_CARE_PROVIDER_SITE_OTHER): Payer: BC Managed Care – PPO

## 2016-04-11 DIAGNOSIS — Z0000 Encounter for general adult medical examination without abnormal findings: Secondary | ICD-10-CM

## 2016-04-11 LAB — POC URINALSYSI DIPSTICK (AUTOMATED)
Bilirubin, UA: NEGATIVE
Blood, UA: NEGATIVE
Glucose, UA: NEGATIVE
Ketones, UA: NEGATIVE
Nitrite, UA: NEGATIVE
Spec Grav, UA: 1.015
Urobilinogen, UA: 1
pH, UA: 8.5

## 2016-04-11 LAB — CBC WITH DIFFERENTIAL/PLATELET
Basophils Absolute: 0 10*3/uL (ref 0.0–0.1)
Basophils Relative: 0.7 % (ref 0.0–3.0)
Eosinophils Absolute: 0.1 10*3/uL (ref 0.0–0.7)
Eosinophils Relative: 2.2 % (ref 0.0–5.0)
HCT: 43 % (ref 36.0–46.0)
Hemoglobin: 14.2 g/dL (ref 12.0–15.0)
Lymphocytes Relative: 40.9 % (ref 12.0–46.0)
Lymphs Abs: 2.1 10*3/uL (ref 0.7–4.0)
MCHC: 33 g/dL (ref 30.0–36.0)
MCV: 86.1 fl (ref 78.0–100.0)
Monocytes Absolute: 0.3 10*3/uL (ref 0.1–1.0)
Monocytes Relative: 5.6 % (ref 3.0–12.0)
Neutro Abs: 2.6 10*3/uL (ref 1.4–7.7)
Neutrophils Relative %: 50.6 % (ref 43.0–77.0)
Platelets: 242 10*3/uL (ref 150.0–400.0)
RBC: 4.99 Mil/uL (ref 3.87–5.11)
RDW: 15.2 % (ref 11.5–15.5)
WBC: 5.2 10*3/uL (ref 4.0–10.5)

## 2016-04-11 LAB — TSH: TSH: 2.24 u[IU]/mL (ref 0.35–4.50)

## 2016-04-11 LAB — LIPID PANEL
Cholesterol: 208 mg/dL — ABNORMAL HIGH (ref 0–200)
HDL: 66.6 mg/dL (ref >39.00)
LDL Cholesterol: 124 mg/dL — ABNORMAL HIGH (ref 0–99)
NonHDL: 141.14
Total CHOL/HDL Ratio: 3
Triglycerides: 86 mg/dL (ref 0.0–149.0)
VLDL: 17.2 mg/dL (ref 0.0–40.0)

## 2016-04-11 LAB — HEPATIC FUNCTION PANEL
ALT: 12 U/L (ref 0–35)
AST: 14 U/L (ref 0–37)
Albumin: 4.6 g/dL (ref 3.5–5.2)
Alkaline Phosphatase: 115 U/L (ref 39–117)
Bilirubin, Direct: 0.1 mg/dL (ref 0.0–0.3)
Total Bilirubin: 0.4 mg/dL (ref 0.2–1.2)
Total Protein: 8 g/dL (ref 6.0–8.3)

## 2016-04-11 LAB — BASIC METABOLIC PANEL
BUN: 11 mg/dL (ref 6–23)
CO2: 27 mEq/L (ref 19–32)
Calcium: 10 mg/dL (ref 8.4–10.5)
Chloride: 106 mEq/L (ref 96–112)
Creatinine, Ser: 0.92 mg/dL (ref 0.40–1.20)
GFR: 85 mL/min (ref >60.00)
Glucose, Bld: 79 mg/dL (ref 70–99)
Potassium: 4 mEq/L (ref 3.5–5.1)
Sodium: 141 mEq/L (ref 135–145)

## 2016-05-12 ENCOUNTER — Ambulatory Visit (INDEPENDENT_AMBULATORY_CARE_PROVIDER_SITE_OTHER): Payer: BC Managed Care – PPO | Admitting: Internal Medicine

## 2016-05-12 ENCOUNTER — Encounter: Payer: Self-pay | Admitting: Internal Medicine

## 2016-05-12 VITALS — BP 110/70 | HR 72 | Temp 98.8°F | Resp 20 | Ht 66.5 in | Wt 204.4 lb

## 2016-05-12 DIAGNOSIS — Z0000 Encounter for general adult medical examination without abnormal findings: Secondary | ICD-10-CM | POA: Diagnosis not present

## 2016-05-12 NOTE — Progress Notes (Signed)
Subjective:    Patient ID: Amanda Petersen, female    DOB: 01-Dec-1970, 45 y.o.   MRN: [id number]  HPI  45 year old patient who is seen today for a preventive health examination.   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 Doing quite well without concerns or complaints Takes no chronic medications. Patient did have a baseline bone density study last year.  That  revealed a T score of minus 1.2 involving the lumbar spine consistent with early osteopenia   Wt Readings from Last 3 Encounters:  05/12/16 204 lb 6.1 oz (92.7 kg)  11/30/15 204 lb 1.6 oz (92.6 kg)  03/12/15 229 lb (103.9 kg)    Social History   Social History  . Marital status: Divorced    Spouse name: N/A  . Number of children: N/A  . Years of education: N/A   Occupational History  . Not on file.   Social History Main Topics  . Smoking status: Never Smoker  . Smokeless tobacco: Never Used  . Alcohol use No  . Drug use: No  . Sexual activity: Not on file   Other Topics Concern  . Not on file   Social History Narrative  . No narrative on file    Past Surgical History:  Procedure Laterality Date  . ABDOMINAL HYSTERECTOMY      No family history on file.  Allergies  Allergen Reactions  . Penicillins     hives    Current Outpatient Prescriptions on File Prior to Visit  Medication Sig Dispense Refill  . ibuprofen (ADVIL,MOTRIN) 200 MG tablet Take 200 mg by mouth every 6 (six) hours as needed.    . polyethylene glycol powder (GLYCOLAX/MIRALAX) powder Take 1 Container by mouth once. One capful daily     No current facility-administered medications on file prior to visit.     BP 110/70 (BP Location: Left Arm, Patient Position: Sitting, Cuff Size: Large)   Pulse 72   Temp 98.8 F (37.1 C) (Oral)   Resp 20   Ht 5' 6.5" (1.689 m)   Wt 204 lb 6.1 oz (92.7 kg)   SpO2 97%   BMI 32.49 kg/m       Review of Systems  Constitutional: Negative for appetite  change, fatigue, fever and unexpected weight change.  HENT: Negative for congestion, dental problem, ear pain, hearing loss, mouth sores, nosebleeds, sinus pressure, sore throat, tinnitus, trouble swallowing and voice change.   Eyes: Negative for photophobia, pain, redness and visual disturbance.  Respiratory: Negative for cough, chest tightness and shortness of breath.   Cardiovascular: Negative for chest pain, palpitations and leg swelling.  Gastrointestinal: Negative for abdominal distention, abdominal pain, blood in stool, constipation, diarrhea, nausea, rectal pain and vomiting.  Genitourinary: Negative for difficulty urinating, dysuria, flank pain, frequency, genital sores, hematuria, menstrual problem, pelvic pain, urgency, vaginal bleeding, vaginal discharge and vaginal pain.  Musculoskeletal: Negative for arthralgias, back pain and neck stiffness.  Skin: Negative for rash.  Neurological: Negative for dizziness, syncope, speech difficulty, weakness, light-headedness, numbness and headaches.  Hematological: Negative for adenopathy. Does not bruise/bleed easily.  Psychiatric/Behavioral: Negative for agitation, behavioral problems, dysphoric mood, self-injury and suicidal ideas. The patient is not nervous/anxious.        Objective:   Physical Exam  Constitutional: She is oriented to person, place, and time. She appears well-developed and well-nourished.  HENT:  Head: Normocephalic and atraumatic.  Right Ear: External ear normal.  Left Ear: External ear normal.  Mouth/Throat:  Oropharynx is clear and moist.  Eyes: Conjunctivae and EOM are normal.  Neck: Normal range of motion. Neck supple. No JVD present. No thyromegaly present.  Cardiovascular: Normal rate, regular rhythm, normal heart sounds and intact distal pulses.   No murmur heard. Pulmonary/Chest: Effort normal and breath sounds normal. She has no wheezes. She has no rales.  Abdominal: Soft. Bowel sounds are normal. She exhibits  no distension and no mass. There is no tenderness. There is no rebound and no guarding.  Musculoskeletal: Normal range of motion. She exhibits no edema or tenderness.  Neurological: She is alert and oriented to person, place, and time. She has normal reflexes. No cranial nerve deficit. She exhibits normal muscle tone. Coordination normal.  Skin: Skin is warm and dry. No rash noted.  Psychiatric: She has a normal mood and affect. Her behavior is normal.          Assessment & Plan:   Preventive health Surgical menopause. Early osteopenia.  Consider repeat bone density study at age 51-50.  Vitamin D and calcium supplements encouraged Mild obesity.  Continue efforts at weight loss.  She does go to a health club 3 times per week  Return in one or 2 years for follow-up  Rogelia Boga

## 2016-05-12 NOTE — Patient Instructions (Signed)
Take a calcium supplement, plus [phone] units of vitamin D  Schedule your mammogram.    It is important that you exercise regularly, at least 20 minutes 3 to 4 times per week.  If you develop chest pain or shortness of breath seek  medical attention.  Return in one year for follow-up

## 2016-05-12 NOTE — Progress Notes (Signed)
Pre visit review using our clinic review tool, if applicable. No additional management support is needed unless otherwise documented below in the visit note. 

## 2016-05-20 ENCOUNTER — Telehealth: Payer: Self-pay | Admitting: Internal Medicine

## 2016-05-20 MED ORDER — POLYETHYLENE GLYCOL 3350 17 GM/SCOOP PO POWD: 1.0000 | Freq: Every day | ORAL | 3 refills | Status: DC

## 2016-05-20 NOTE — Telephone Encounter (Signed)
Pt saw Dr Kirtland BouchardK last week and thought rx polyethylene glycol powder (GLYCOLAX/MIRALAX) powder Was to be called in. But not at the pharmacy.  Can you send to  [facility]/ [address]

## 2016-05-20 NOTE — Telephone Encounter (Signed)
Rx sent to pharmacy   

## 2016-10-20 ENCOUNTER — Ambulatory Visit: Payer: BC Managed Care – PPO | Admitting: Family Medicine

## 2016-10-20 ENCOUNTER — Encounter: Payer: Self-pay | Admitting: Family Medicine

## 2016-10-20 ENCOUNTER — Ambulatory Visit (INDEPENDENT_AMBULATORY_CARE_PROVIDER_SITE_OTHER): Payer: BC Managed Care – PPO | Admitting: Family Medicine

## 2016-10-20 VITALS — BP 108/82 | HR 66 | Temp 98.8°F | Wt 210.8 lb

## 2016-10-20 DIAGNOSIS — R21 Rash and other nonspecific skin eruption: Secondary | ICD-10-CM | POA: Diagnosis not present

## 2016-10-20 MED ORDER — TRIAMCINOLONE ACETONIDE 0.1 % EX CREA: 1.0000 "application " | TOPICAL_CREAM | Freq: Two times a day (BID) | CUTANEOUS | 0 refills | Status: DC

## 2016-10-20 NOTE — Progress Notes (Signed)
Subjective:    Patient ID: Amanda Petersen, female    DOB: Jun 09, 1971, 46 y.o.   MRN: [id number]  HPI  Amanda Petersen is a 46 year old female who presents with a rash on her neck that started  Appearance of rash at onset: 2 weeks ago after having a permanent in her hair. 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 Initial distribution: base of scalp which spread down her neck. She reports it has worsened after wearing a cap at work. Discomfort associated. Burning sensation and tenderness to palpation.  Associated symptoms: Pruritus.  Denies: fever, chills sweats, myalgias, difficulty swallowing, hoarseness, SOB, N/V, abdominal pain, or tightening of throat.  No new exposures of soaps, lotions, laundry detergent, fabric softeners, foods, medications, plants, animals, or insects with exception of hair treatment at stylist.  Treatment at home includes cortisone cream, neosporin, vitamin e provided limited benefit. Vaseline has also provided moderate benefit. Rubbing alcohol has aggravated the symptom.  Review of Systems  Constitutional: Negative for chills, fatigue and fever.  Respiratory: Negative for cough, shortness of breath and wheezing.   Cardiovascular: Negative for chest pain and palpitations.  Gastrointestinal: Negative for abdominal pain, diarrhea, nausea and vomiting.  Musculoskeletal: Negative for myalgias and neck pain.  Skin: Positive for rash.  Neurological: Negative for dizziness, light-headedness and headaches.   No past medical history on file.   Social History   Social History  . Marital status: Divorced    Spouse name: N/A  . Number of children: N/A  . Years of education: N/A   Occupational History  . Not on file.   Social History Main Topics  . Smoking status: Never Smoker  . Smokeless tobacco: Never Used  . Alcohol use No  . Drug use: No  . Sexual activity: Not on file   Other  Topics Concern  . Not on file   Social History Narrative  . No narrative on file    Past Surgical History:  Procedure Laterality Date  . ABDOMINAL HYSTERECTOMY      No family history on file.  Allergies  Allergen Reactions  . Penicillins     hives    Current Outpatient Prescriptions on File Prior to Visit  Medication Sig Dispense Refill  . ibuprofen (ADVIL,MOTRIN) 200 MG tablet Take 200 mg by mouth every 6 (six) hours as needed.    . polyethylene glycol powder (GLYCOLAX/MIRALAX) powder Take 255 g by mouth daily. One capful daily 255 g 3   No current facility-administered medications on file prior to visit.     BP 108/82 (BP Location: Left Arm, Patient Position: Sitting, Cuff Size: Normal)   Pulse 66   Temp 98.8 F (37.1 C) (Oral)   Wt 210 lb 12.8 oz (95.6 kg)   SpO2 98%   BMI 33.51 kg/m       Objective:   Physical Exam  Constitutional: She appears well-developed and well-nourished.  Eyes: Pupils are equal, round, and reactive to light. No scleral icterus.  Neck: Neck supple.  Cardiovascular: Normal rate and regular rhythm.   Pulmonary/Chest: Effort normal and breath sounds normal. She has no wheezes. She has no rales.  Lymphadenopathy:    She has no cervical adenopathy.  Skin: Skin is warm and dry.  Base of scalp and right side of neck exhibits raised, erythematous papules.  Area is limited to approximately 2.5 cm x 2.5 cm.  No drainage noted.  No central pallor present. Hair is present and no patchy areas are noted.        Assessment & Plan:  1. Rash, skin Suspect rash is related to irritation from chemical in hair product that was used. Advised kenalog and moisture to facilitate healing of area. If symptoms do not improve with treatment in 3 to 4 days, worsen, or she develops new symptoms, follow up is recommended.  - triamcinolone cream (KENALOG) 0.1 %; Apply 1 application topically 2 (two) times daily.  Dispense: 30 g; Refill: 0  Amanda Mc,  FNP-C

## 2016-10-20 NOTE — Progress Notes (Signed)
Pre visit review using our clinic review tool, if applicable. No additional management support is needed unless otherwise documented below in the visit note. 

## 2016-10-20 NOTE — Patient Instructions (Signed)
Please use cream for rash twice daily. Also, please focus on moisturizing area as we discussed especially after you get out of the shower. If rash does not improve with treatment in 3 to 4 days, worsens, or you develop new symptoms, please follow up for further evaluation and treatment.   Contact Dermatitis Introduction Dermatitis is redness, soreness, and swelling (inflammation) of the skin. Contact dermatitis is a reaction to certain substances that touch the skin. You either touched something that irritated your skin, or you have allergies to something you touched. Follow these instructions at home: Skin Care  Moisturize your skin as needed.  Apply cool compresses to the affected areas.  Try taking a bath with:  Epsom salts. Follow the instructions on the package. You can get these at a pharmacy or grocery store.  Baking soda. Pour a small amount into the bath as told by your doctor.  Colloidal oatmeal. Follow the instructions on the package. You can get this at a pharmacy or grocery store.  Try applying baking soda paste to your skin. Stir water into baking soda until it looks like paste.  Do not scratch your skin.  Bathe less often.  Bathe in lukewarm water. Avoid using hot water. Medicines  Take or apply over-the-counter and prescription medicines only as told by your doctor.  If you were prescribed an antibiotic medicine, take or apply your antibiotic as told by your doctor. Do not stop taking the antibiotic even if your condition starts to get better. General instructions  Keep all follow-up visits as told by your doctor. This is important.  Avoid the substance that caused your reaction. If you do not know what caused it, keep a journal to try to track what caused it. Write down:  What you eat.  What cosmetic products you use.  What you drink.  What you wear in the affected area. This includes jewelry.  If you were given a bandage (dressing), take care of it as  told by your doctor. This includes when to change and remove it. Contact a doctor if:  You do not get better with treatment.  Your condition gets worse.  You have signs of infection such as:  Swelling.  Tenderness.  Redness.  Soreness.  Warmth.  You have a fever.  You have new symptoms. Get help right away if:  You have a very bad headache.  You have neck pain.  Your neck is stiff.  You throw up (vomit).  You feel very sleepy.  You see red streaks coming from the affected area.  Your bone or joint underneath the affected area becomes painful after the skin has healed.  The affected area turns darker.  You have trouble breathing. This information is not intended to replace advice given to you by your health care provider. Make sure you discuss any questions you have with your health care provider. Document Released: 06/08/2009 Document Revised: 01/17/2016 Document Reviewed: 12/27/2014  2017 [employer]vier

## 2018-02-10 ENCOUNTER — Encounter: Payer: BC Managed Care – PPO | Admitting: Internal Medicine

## 2018-03-09 ENCOUNTER — Ambulatory Visit (INDEPENDENT_AMBULATORY_CARE_PROVIDER_SITE_OTHER): Payer: BC Managed Care – PPO | Admitting: Internal Medicine

## 2018-03-09 ENCOUNTER — Encounter: Payer: Self-pay | Admitting: Internal Medicine

## 2018-03-09 VITALS — BP 120/60 | HR 74 | Temp 98.8°F | Ht 67.0 in | Wt 204.0 lb

## 2018-03-09 DIAGNOSIS — Z0000 Encounter for general adult medical examination without abnormal findings: Secondary | ICD-10-CM

## 2018-03-09 LAB — CBC WITH DIFFERENTIAL/PLATELET
Basophils Absolute: 0 10*3/uL (ref 0.0–0.1)
Basophils Relative: 0.6 % (ref 0.0–3.0)
Eosinophils Absolute: 0.1 10*3/uL (ref 0.0–0.7)
Eosinophils Relative: 2.6 % (ref 0.0–5.0)
HCT: 42.5 % (ref 36.0–46.0)
Hemoglobin: 14 g/dL (ref 12.0–15.0)
Lymphocytes Relative: 44.9 % (ref 12.0–46.0)
Lymphs Abs: 2.1 10*3/uL (ref 0.7–4.0)
MCHC: 32.9 g/dL (ref 30.0–36.0)
MCV: 87 fl (ref 78.0–100.0)
Monocytes Absolute: 0.4 10*3/uL (ref 0.1–1.0)
Monocytes Relative: 7.5 % (ref 3.0–12.0)
Neutro Abs: 2.1 10*3/uL (ref 1.4–7.7)
Neutrophils Relative %: 44.4 % (ref 43.0–77.0)
Platelets: 253 10*3/uL (ref 150.0–400.0)
RBC: 4.88 Mil/uL (ref 3.87–5.11)
RDW: 15.2 % (ref 11.5–15.5)
WBC: 4.8 10*3/uL (ref 4.0–10.5)

## 2018-03-09 LAB — COMPREHENSIVE METABOLIC PANEL
ALT: 20 U/L (ref 0–35)
AST: 15 U/L (ref 0–37)
Albumin: 4.4 g/dL (ref 3.5–5.2)
Alkaline Phosphatase: 127 U/L — ABNORMAL HIGH (ref 39–117)
BUN: 12 mg/dL (ref 6–23)
CO2: 26 mEq/L (ref 19–32)
Calcium: 9.6 mg/dL (ref 8.4–10.5)
Chloride: 105 mEq/L (ref 96–112)
Creatinine, Ser: 0.89 mg/dL (ref 0.40–1.20)
GFR: 87.57 mL/min (ref >60.00)
Glucose, Bld: 96 mg/dL (ref 70–99)
Potassium: 4 mEq/L (ref 3.5–5.1)
Sodium: 141 mEq/L (ref 135–145)
Total Bilirubin: 0.4 mg/dL (ref 0.2–1.2)
Total Protein: 7.4 g/dL (ref 6.0–8.3)

## 2018-03-09 LAB — LIPID PANEL
Cholesterol: 210 mg/dL — ABNORMAL HIGH (ref 0–200)
HDL: 59.5 mg/dL (ref >39.00)
LDL Cholesterol: 131 mg/dL — ABNORMAL HIGH (ref 0–99)
NonHDL: 150.28
Total CHOL/HDL Ratio: 4
Triglycerides: 94 mg/dL (ref 0.0–149.0)
VLDL: 18.8 mg/dL (ref 0.0–40.0)

## 2018-03-09 LAB — TSH: TSH: 2.25 u[IU]/mL (ref 0.35–4.50)

## 2018-03-09 NOTE — Progress Notes (Signed)
Subjective:    Patient ID: Amanda Petersen, female    DOB: 1971-08-23, 47 y.o.   MRN: [id number]  HPI  47 year old patient who is seen today for a health maintenance exam.  She enjoys excellent health.  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 She is doing quite well.  She is being followed by podiatrist for plantar fasciitis but otherwise no concerns or complaints.  She takes no chronic medications except for an occasional ibuprofen.  She does have a history of mild osteopenia Family history .  Mother died at 2151 of complications of asthma.  Father rate 4774 has a history of prostate cancer and hypertension.  One brother is in poor health with fibromyalgia and a history of hypertension and congestive heart failure another sister disabled due to fibromyalgia  History reviewed. No pertinent past medical history.   Social History   Socioeconomic History  . Marital status: Divorced    Spouse name: Not on file  . Number of children: Not on file  . Years of education: Not on file  . Highest education level: Not on file  Occupational History  . Not on file  Social Needs  . Financial resource strain: Not on file  . Food insecurity:    Worry: Not on file    Inability: Not on file  . Transportation needs:    Medical: Not on file    Non-medical: Not on file  Tobacco Use  . Smoking status: Never Smoker  . Smokeless tobacco: Never Used  Substance and Sexual Activity  . Alcohol use: No  . Drug use: No  . Sexual activity: Not on file  Lifestyle  . Physical activity:    Days per week: Not on file    Minutes per session: Not on file  . Stress: Not on file  Relationships  . Social connections:    Talks on phone: Not on file    Gets together: Not on file    Attends religious service: Not on file    Active member of club or organization: Not on file    Attends meetings of clubs or organizations: Not on file    Relationship status: Not on  file  . Intimate partner violence:    Fear of current or ex partner: Not on file    Emotionally abused: Not on file    Physically abused: Not on file    Forced sexual activity: Not on file  Other Topics Concern  . Not on file  Social History Narrative  . Not on file    Past Surgical History:  Procedure Laterality Date  . ABDOMINAL HYSTERECTOMY      History reviewed. No pertinent family history.  Allergies  Allergen Reactions  . Penicillins     hives    Current Outpatient Medications on File Prior to Visit  Medication Sig Dispense Refill  . ibuprofen (ADVIL,MOTRIN) 200 MG tablet Take 200 mg by mouth every 6 (six) hours as needed.     No current facility-administered medications on file prior to visit.     BP 120/60 (BP Location: Right Arm, Patient Position: Sitting, Cuff Size: Large)   Pulse 74   Temp 98.8 F (37.1 C) (Oral)   Ht 5\' 7"  (1.702 m)   Wt 204 lb (92.5 kg)   SpO2 98%   BMI 31.95 kg/m       Review of Systems  Constitutional: Negative.   HENT: Negative for congestion, dental problem,  hearing loss, rhinorrhea, sinus pressure, sore throat and tinnitus.   Eyes: Negative for pain, discharge and visual disturbance.  Respiratory: Negative for cough and shortness of breath.   Cardiovascular: Negative for chest pain, palpitations and leg swelling.  Gastrointestinal: Positive for constipation. Negative for abdominal distention, abdominal pain, blood in stool, diarrhea, nausea and vomiting.  Genitourinary: Negative for difficulty urinating, dysuria, flank pain, frequency, hematuria, pelvic pain, urgency, vaginal bleeding, vaginal discharge and vaginal pain.  Musculoskeletal: Negative for arthralgias, gait problem and joint swelling.  Skin: Negative for rash.  Neurological: Negative for dizziness, syncope, speech difficulty, weakness, numbness and headaches.  Hematological: Negative for adenopathy.  Psychiatric/Behavioral: Negative for agitation, behavioral  problems and dysphoric mood. The patient is not nervous/anxious.        Objective:   Physical Exam  Constitutional: She is oriented to person, place, and time. She appears well-developed and well-nourished.  HENT:  Head: Normocephalic.  Right Ear: External ear normal.  Left Ear: External ear normal.  Mouth/Throat: Oropharynx is clear and moist.  Eyes: Pupils are equal, round, and reactive to light. Conjunctivae and EOM are normal.  Neck: Normal range of motion. Neck supple. No thyromegaly present.  Cardiovascular: Normal rate, regular rhythm, normal heart sounds and intact distal pulses.  Pulmonary/Chest: Effort normal and breath sounds normal.  Abdominal: Soft. Bowel sounds are normal. She exhibits no mass. There is no tenderness.  Musculoskeletal: Normal range of motion.  Lymphadenopathy:    She has no cervical adenopathy.  Neurological: She is alert and oriented to person, place, and time.  Skin: Skin is warm and dry. No rash noted.  Psychiatric: She has a normal mood and affect. Her behavior is normal.          Assessment & Plan:  Preventive health examination  Surgical menopause History of mild osteopenia Overweight Chronic constipation  We will review updated lab More rigorous exercise modest weight loss encouraged Colonoscopy age 32 Consider follow-up bone density study in 1 year  Gordy Savers

## 2018-03-09 NOTE — Patient Instructions (Signed)
It is important that you exercise regularly, at least 20 minutes 3 to 4 times per week.  If you develop chest pain or shortness of breath seek  medical attention.  Take a calcium supplement, plus 800-1200 units of vitamin D  Return in one year for follow-up   

## 2018-09-10 ENCOUNTER — Encounter: Payer: Self-pay | Admitting: Family Medicine

## 2018-09-10 ENCOUNTER — Ambulatory Visit (INDEPENDENT_AMBULATORY_CARE_PROVIDER_SITE_OTHER): Admitting: Family Medicine

## 2018-09-10 VITALS — BP 98/70 | HR 62 | Temp 98.0°F | Wt 202.0 lb

## 2018-09-10 DIAGNOSIS — K5909 Other constipation: Secondary | ICD-10-CM | POA: Diagnosis not present

## 2018-09-10 DIAGNOSIS — B351 Tinea unguium: Secondary | ICD-10-CM | POA: Diagnosis not present

## 2018-09-10 NOTE — Progress Notes (Signed)
Subjective:    Patient ID: Amanda Petersen, female    DOB: [birth year], 48 y.o.   MRN: [id number]  No chief complaint on file.   HPI Patient was seen today for follow-up on chronic conditions and TOC, previously seen by Dr. Amador Cunas.   Constipation: -endorses continued constipation s/p hysterectomy and colon resection for endometriosis with colonic involvement. - taking MiraLAX daily and sometimes senna for her symptoms. -Drinking 6-7 large cups of water per day -Has tried to increase fiber in notes worsening symptoms. -Exercising a few times per week  Toe concern: -pt notes dark L great toenail and 5th digit -present x yrs -has not tried anything -was getting pedicures in the past -denies pain, cracked nails, drainage.  History reviewed. No pertinent past medical history.  Allergies  Allergen Reactions  . Penicillins     hives    ROS General: Denies fever, chills, night sweats, changes in weight, changes in appetite HEENT: Denies headaches, ear pain, changes in vision, rhinorrhea, sore throat CV: Denies CP, palpitations, SOB, orthopnea Pulm: Denies SOB, cough, wheezing GI: Denies abdominal pain, nausea, vomiting, diarrhea   +constipation GU: Denies dysuria, hematuria, frequency, vaginal discharge Msk: Denies muscle cramps, joint pains Neuro: Denies weakness, numbness, tingling Skin: Denies rashes, bruising  +darkened toenails Psych: Denies depression, anxiety, hallucinations    Objective:    Blood pressure 98/70, pulse 62, temperature 98 F (36.7 C), temperature source Oral, weight 202 lb (91.6 kg), SpO2 98 %.   Gen. Pleasant, well-nourished, in no distress, normal affect   HEENT: [place]/AT, face symmetric,  no scleral icterus, PERRLA, nares patent without drainage, pharynx without erythema or exudate. Lungs: no accessory muscle use, CTAB, no wheezes or rales Cardiovascular: RRR, no m/r/g, no peripheral edema Abdomen: BS present, soft, NT/ND Neuro:  A&Ox3, CN  II-XII intact, normal gait Skin:  Warm, no lesions/ rash.  L Great toes with thickened dark toenail with ridges.  L 5th digit similar in appearance.  DP pulse normal.  Wt Readings from Last 3 Encounters:  09/10/18 202 lb (91.6 kg)  03/09/18 204 lb (92.5 kg)  10/20/16 210 lb 12.8 oz (95.6 kg)    Lab Results  Component Value Date   WBC 4.8 03/09/2018   HGB 14.0 03/09/2018   HCT 42.5 03/09/2018   PLT 253.0 03/09/2018   GLUCOSE 96 03/09/2018   CHOL 210 (H) 03/09/2018   TRIG 94.0 03/09/2018   HDL 59.50 03/09/2018   LDLCALC 131 (H) 03/09/2018   ALT 20 03/09/2018   AST 15 03/09/2018   NA 141 03/09/2018   K 4.0 03/09/2018   CL 105 03/09/2018   CREATININE 0.89 03/09/2018   BUN 12 03/09/2018   CO2 26 03/09/2018   TSH 2.25 03/09/2018    Assessment/Plan:  Chronic constipation -Continue MiraLAX daily as needed and senna as needed -Continue increasing p.o. intake of water -Continue keeping a food diary -Discussed trying a low FODMAP diet -Patient given handout -Consider follow-up with GI for continued symptoms  Toenail fungus -Discussed treatment options -Patient opts for OTC treatment -Advised will likely take several months to notice improvement -Given handout  Follow-up PRN  Abbe Amsterdam, MD

## 2018-09-10 NOTE — Patient Instructions (Signed)
Chronic Constipation  Chronic constipation is a condition in which a person has three or fewer bowel movements a week, for three months or longer. This condition is especially common in older adults. The two main kinds of chronic constipation are secondary constipation and functional constipation. Secondary constipation results from another condition or a treatment. Functional constipation, also called primary or idiopathic constipation, is divided into three types:  Normal transit constipation. In this type, movement of stool through the colon (stool transit) occurs normally.  Slow transit constipation. In this type, stool moves slowly through the colon.  Outlet constipation or pelvic floor dysfunction. In this type, the nerves and muscles that empty the rectum do not work normally. What are the causes? Causes of secondary constipation may include:  Failing to drink enough fluid, eat enough food or fiber, or get physically active.  Pregnancy.  A tear in the anus (anal fissure).  Blockage in the bowel (bowel obstruction).  Narrowing of the bowel (bowel stricture).  Having a long-term medical condition, such as: ? Diabetes. ? Hypothyroidism. ? Multiple sclerosis. ? Parkinson disease. ? Stroke. ? Spinal cord injury. ? Dementia. ? Colon cancer. ? Inflammatory bowel disease (IBD). ? Iron-deficiency anemia. ? Outward collapse of the rectum (rectal prolapse). ? Hemorrhoids.  Taking certain medicines, including: ? Narcotics. These are a certain type of prescription pain medicine. ? Antacids. ? Iron supplements. ? Water pills (diuretics). ? Certain blood pressure medicines. ? Anti-seizure medicines. ? Antidepressants. ? Medicines for Parkinson disease. The cause of functional constipation is not known, but some conditions are associated with it. These conditions include:  Stress.  Problems in the nerves and muscles that control stool transit.  Weak or impaired pelvic floor  muscles. What increases the risk? You may be at higher risk for chronic constipation if you:  Are older than age 70.  Are female.  Live in a long-term care facility.  Do not get much exercise or physical activity (have a sedentary lifestyle).  Do not drink enough fluids.  Do not eat enough food, especially fiber.  Have a long-term disease.  Have a mental health disorder or eating disorder.  Take many medicines. What are the signs or symptoms? The main symptom of chronic constipation is having three or fewer bowel movements a week for several weeks. Other signs and symptoms may vary from person to person. These include:  Pushing hard (straining) to pass stool.  Painful bowel movements.  Having hard or lumpy stools.  Having lower belly discomfort, such as cramps or bloating.  Being unable to have a bowel movement when you feel the urge.  Feeling like you still need to pass stool after a bowel movement.  Feeling that you have something in your rectum that is blocking or preventing bowel movements.  Seeing blood on the toilet paper or in your stool.  Worsening confusion (in older adults). How is this diagnosed? This condition may be diagnosed based on:  Symptoms and medical history. You will be asked about your symptoms, lifestyle, diet, and any medicines that you are taking.  Physical exam. ? Your belly (abdomen) will be examined. ? A digital rectal exam may be done. For this exam, a health care provider places a lubricated, gloved finger into the rectum.  Other tests to check for any underlying causes of your constipation. These may be ordered if you have bleeding in your rectum, weight loss, or a family history of colon cancer. In these cases, you may have: ? Imaging studies of   the colon. These may include X-ray, ultrasound, or CT scan. ? Blood tests. ? A procedure to examine the inside of your colon (colonoscopy). ? More specialized tests to check:  Whether  your anal sphincter works well. This is a ring-shaped muscle that controls the closing of the anus.  How well food moves through your colon. ? Tests to measure the nerve signal in your pelvic floor muscles (electromyography). How is this treated? Treatment for chronic constipation depends on the cause. Most often, treatment starts with:  Being more active and getting regular exercise.  Drinking more fluids.  Adding fiber to your diet. Sources of fiber include fruits, vegetables, whole grains, and fiber supplements.  Using medicines such as stool softeners or medicines that increase contractions in your digestive system (pro-motility agents).  Training your pelvic muscles with biofeedback.  Surgery, if there is obstruction. Treatment for secondary chronic constipation depends on the underlying condition. You may need to:  Stop or change some medicines if they cause constipation.  Use a fiber supplement (bulk laxative) or stool softener.  Use prescription laxative. This works by absorbing water into your colon (osmotic laxative). You may also need to see a specialist who treats conditions of the digestive system (gastroenterologist). Follow these instructions at home:   Take over-the-counter and prescription medicines only as told by your health care provider.  If you are taking a laxative, take it as told by your health care provider.  Eat a balanced diet that includes enough fiber. Ask your health care provider to recommend a diet that is right for you.  Drink clear fluids, especially water. Avoid drinking alcohol, caffeine, and soda.  Drink enough fluid to keep your urine pale yellow.  Get some physical activity every day. Ask your health care provider what physical activities are safe for you.  Get colon cancer screenings as told by your health care provider.  Keep all follow-up visits as told by your health care provider. This is important. Contact a health care  provider if:  You are having three or fewer bowel movements a week.  Your stools are hard or lumpy.  You notice blood on the toilet paper or in your stool after you have a bowel movement.  You have unexplained weight loss.  You have rectum (rectal) pain.  You have stool leakage.  You experience nausea or vomiting. Get help right away if:  You have rectal bleeding or you pass blood clots.  You have severe rectal pain.  You have body tissue that pushes out (protrudes) from your anus.  You have severe pain or bloating (distension) in your abdomen.  You have vomiting that you cannot control. Summary  Chronic constipation is a condition in which a person has three or fewer bowel movements a week, for three months or longer.  You may have a higher risk for this condition if you are an older adult, or if you do not drink enough water or get enough physical activity (are sedentary).  Treatment for this condition depends on the cause. Most treatments for chronic constipation include adding fiber to your diet, drinking more fluids, and getting more physical activity. You may also need to treat any underlying medical conditions or stop or change certain medicines if they cause constipation.  If lifestyle changes do not relieve constipation, your health care provider may recommend taking a laxative. This information is not intended to replace advice given to you by your health care provider. Make sure you discuss any questions you   have with your health care provider. Document Released: 03/10/2017 Document Revised: 04/28/2017 Document Reviewed: 04/28/2017 Elsevier Interactive Patient Education  2019 [employer]  Fungal Nail Infection A fungal nail infection is a common infection of the toenails or fingernails. This condition affects toenails more often than fingernails. It often affects the great, or big, toes. More than one nail may be infected. The condition can be passed from person  to person (is contagious). What are the causes? This condition is caused by a fungus. Several types of fungi can cause the infection. These fungi are common in moist and warm areas. If your hands or feet come into contact with the fungus, it may get into a crack in your fingernail or toenail and cause the infection. What increases the risk? The following factors may make you more likely to develop this condition:  Being female.  Being of older age.  Living with someone who has the fungus.  Walking barefoot in areas where the fungus thrives, such as showers or locker rooms.  Wearing shoes and socks that cause your feet to sweat.  Having a nail injury or a recent nail surgery.  Having certain medical conditions, such as: ? Athlete's foot. ? Diabetes. ? Psoriasis. ? Poor circulation. ? A weak body defense system (immune system). What are the signs or symptoms? Symptoms of this condition include:  A pale spot on the nail.  Thickening of the nail.  A nail that becomes yellow or brown.  A brittle or ragged nail edge.  A crumbling nail.  A nail that has lifted away from the nail bed. How is this diagnosed? This condition is diagnosed with a physical exam. Your health care provider may take a scraping or clipping from your nail to test for the fungus. How is this treated? Treatment is not needed for mild infections. If you have significant nail changes, treatment may include:  Antifungal medicines taken by mouth (orally). You may need to take the medicine for several weeks or several months, and you may not see the results for a long time. These medicines can cause side effects. Ask your health care provider what problems to watch for.  Antifungal nail polish or nail cream. These may be used along with oral antifungal medicines.  Laser treatment of the nail.  Surgery to remove the nail. This may be needed for the most severe infections. It can take a long time, usually up to a  year, for the infection to go away. The infection may also come back. Follow these instructions at home: Medicines  Take or apply over-the-counter and prescription medicines only as told by your health care provider.  Ask your health care provider about using over-the-counter mentholated ointment on your nails. Nail care  Trim your nails often.  Wash and dry your hands and feet every day.  Keep your feet dry: ? Wear absorbent socks, and change your socks frequently. ? Wear shoes that allow air to circulate, such as sandals or canvas tennis shoes. Throw out old shoes.  Do not use artificial nails.  If you go to a nail salon, make sure you choose one that uses clean instruments.  Use antifungal foot powder on your feet and in your shoes. General instructions  Do not share personal items, such as towels or nail clippers.  Do not walk barefoot in shower rooms or locker rooms.  Wear rubber gloves if you are working with your hands in wet areas.  Keep all follow-up visits as told  by your health care provider. This is important. Contact a health care provider if: Your infection is not getting better or it is getting worse after several months. Summary  A fungal nail infection is a common infection of the toenails or fingernails.  Treatment is not needed for mild infections. If you have significant nail changes, treatment may include taking medicine orally and applying medicine to your nails.  It can take a long time, usually up to a year, for the infection to go away. The infection may also come back.  Take or apply over-the-counter and prescription medicines only as told by your health care provider.  Follow instructions for taking care of your nails to help prevent infection from coming back or spreading. This information is not intended to replace advice given to you by your health care provider. Make sure you discuss any questions you have with your health care  provider. Document Released: 08/08/2000 Document Revised: 01/15/2018 Document Reviewed: 01/15/2018 Elsevier Interactive Patient Education  2019 [employer].

## 2018-12-15 ENCOUNTER — Telehealth: Payer: Self-pay | Admitting: *Deleted

## 2018-12-15 NOTE — Telephone Encounter (Signed)
That is fine 

## 2018-12-15 NOTE — Telephone Encounter (Signed)
Copied from CRM [id number]. Topic: Appointment Scheduling - Transfer of Care >> Dec 15, 2018  9:50 AM Fanny Bien wrote: Pt is requesting to transfer FROM: [facility]  Pt is requesting to transfer TO: [facility]  Reason for requested transfer: closer to home   Send CRM to patient's current PCP (transferring FROM).

## 2018-12-16 NOTE — Telephone Encounter (Signed)
Dr Salomon Fick is ok with pt transferring care

## 2018-12-16 NOTE — Telephone Encounter (Signed)
Ok with me 

## 2018-12-29 ENCOUNTER — Telehealth: Payer: Self-pay | Admitting: Cardiology

## 2018-12-29 NOTE — Telephone Encounter (Signed)
Pt scheduled for an in office visit on 5/8 at 330 with Dr. Cristal Deer.

## 2018-12-29 NOTE — Telephone Encounter (Signed)
New Message:    Patient calling concerning her appt tomorrow. Patient states some one called her. Didn't see a note. Please call patient.

## 2018-12-30 ENCOUNTER — Ambulatory Visit: Admitting: Cardiology

## 2018-12-31 ENCOUNTER — Ambulatory Visit: Admitting: Cardiology

## 2018-12-31 ENCOUNTER — Ambulatory Visit (INDEPENDENT_AMBULATORY_CARE_PROVIDER_SITE_OTHER): Admitting: Cardiology

## 2018-12-31 ENCOUNTER — Other Ambulatory Visit: Payer: Self-pay

## 2018-12-31 VITALS — BP 131/86 | HR 92 | Ht 67.0 in | Wt 202.0 lb

## 2018-12-31 DIAGNOSIS — R0602 Shortness of breath: Secondary | ICD-10-CM

## 2018-12-31 DIAGNOSIS — R002 Palpitations: Secondary | ICD-10-CM

## 2018-12-31 DIAGNOSIS — Z8249 Family history of ischemic heart disease and other diseases of the circulatory system: Secondary | ICD-10-CM | POA: Diagnosis not present

## 2018-12-31 DIAGNOSIS — Z7189 Other specified counseling: Secondary | ICD-10-CM | POA: Diagnosis not present

## 2018-12-31 NOTE — Patient Instructions (Signed)
Medication Instructions:  Your Physician recommend you continue on your current medication as directed.    If you need a refill on your cardiac medications before your next appointment, please call your pharmacy.   Lab work: None  Testing/Procedures: Your physician has requested that you have an echocardiogram. Echocardiography is a painless test that uses sound waves to create images of your heart. It provides your doctor with information about the size and shape of your heart and how well your heart's chambers and valves are working. This procedure takes approximately one hour. There are no restrictions for this procedure. [address]   Follow-Up: Follow up will be based of ECHO results.

## 2018-12-31 NOTE — Progress Notes (Signed)
Cardiology Office Note:    Date:  12/31/2018   ID:  Amanda Petersen, DOB 03-09-1971, MRN [id number]  PCP:  Amanda Clan, MD  Cardiologist:  Amanda Red, MD PhD  Referring MD: Amanda Clan, MD   Chief Complaint  Patient presents with  . Shortness of Breath  . Chest Pain    History of Present Illness:    Amanda Petersen is a 48 y.o. female without prior cardiac history who is seen as a new consult at the request of Amanda Clan, MD for the evaluation and management of chest pain and shortness of breath.  Chest pain/palpitations: -Initial onset: about a month ago. Noted sinus congestion, took OTC medications. That night, she saw a heart rate of 180 on her fitbit. She switched OTC medications, came off caffeine and chocolate. However, she then noted some intermittent chest tightness, and three days ago her breathing got somewhat worse. She has also noted sinus drainage in that time. -Currently, the heart rate has become less of an issue for her. The shortness of breath is now continuous for the last several days. She notices when she first wakes up in the AM, and her chest feels the most tight when she feels like she can't breath. She also can't lie down not due to shortness of breath but due to sinus drainage--has been sleeping in chair. -No clear aggravating/alleviating factors for the shortness of breath/tightness, but her palpitations are better with drinking water and relaxing -No fevers/chills -Stopped all medications. -Prior cardiac history: none -Prior ECG: none in our system, per PCP included documents is NSR. -Prior workup: ECG, CXR unremarkable -Prior treatment: none -Alcohol: none -Tobacco: none -Comorbidities: none -Exercise level: still able to walk, walks up hills, but she has gone from 10,000-15,000 steps/day to 3000 and feels tired. -Cardiac ROS: no PND, no orthopnea, no LE edema, no syncope. rare lightheadedness late at night. -Family history:  dad and brother have a history of strokes. Sisters (5) all have hypertension, 3 sisters also have diabetes. No history of MI. She is actually attending her sister's funeral tomorrow.  History reviewed. No pertinent past medical history.  Past Surgical History:  Procedure Laterality Date  . ABDOMINAL HYSTERECTOMY     full    Current Medications: Current Outpatient Medications on File Prior to Visit  Medication Sig  . polyethylene glycol (MIRALAX / GLYCOLAX) packet Take 17 g by mouth daily.   No current facility-administered medications on file prior to visit.      Allergies:   Penicillins and Sudafed [pseudoephedrine hcl]   Social History   Socioeconomic History  . Marital status: Divorced    Spouse name: Not on file  . Number of children: Not on file  . Years of education: Not on file  . Highest education level: Not on file  Occupational History  . Not on file  Social Needs  . Financial resource strain: Not on file  . Food insecurity:    Worry: Not on file    Inability: Not on file  . Transportation needs:    Medical: Not on file    Non-medical: Not on file  Tobacco Use  . Smoking status: Never Smoker  . Smokeless tobacco: Never Used  Substance and Sexual Activity  . Alcohol use: No  . Drug use: No  . Sexual activity: Not on file  Lifestyle  . Physical activity:    Days per week: Not on file    Minutes per session: Not on file  .  Stress: Not on file  Relationships  . Social connections:    Talks on phone: Not on file    Gets together: Not on file    Attends religious service: Not on file    Active member of club or organization: Not on file    Attends meetings of clubs or organizations: Not on file    Relationship status: Not on file  Other Topics Concern  . Not on file  Social History Narrative  . Not on file     Family History: The patient's family history includes Asthma in her mother; Cancer in her father; Diabetes in her brother and sister;  Hypertension in her father and mother.  ROS:   Please see the history of present illness.  Additional pertinent ROS:  Constitutional: Negative for chills, fever, night sweats, unintentional weight loss  HENT: Negative for ear pain and hearing loss.  Positive for sinus drainage Eyes: Negative for loss of vision and eye pain.  Respiratory: Negative for cough, sputum, wheezing.   Cardiovascular: See HPI. Gastrointestinal: Negative for abdominal pain, melena, and hematochezia.  Genitourinary: Negative for dysuria and hematuria.  Musculoskeletal: Negative for falls and myalgias.  Skin: Negative for itching and rash.  Neurological: Negative for focal weakness, focal sensory changes and loss of consciousness.  Endo/Heme/Allergies: Does not bruise/bleed easily.    EKGs/Labs/Other Studies Reviewed:    The following studies were reviewed today: No prior cardiac studies  EKG:  EKG is personally reviewed.  The ekg from primary care office shows NSR  Recent Labs: 03/09/2018: ALT 20; BUN 12; Creatinine, Ser 0.89; Hemoglobin 14.0; Platelets 253.0; Potassium 4.0; Sodium 141; TSH 2.25  Recent Lipid Panel    Component Value Date/Time   CHOL 210 (H) 03/09/2018 0838   TRIG 94.0 03/09/2018 0838   HDL 59.50 03/09/2018 0838   CHOLHDL 4 03/09/2018 0838   VLDL 18.8 03/09/2018 0838   LDLCALC 131 (H) 03/09/2018 0838    Physical Exam:    VS:  BP 131/86   Pulse 92   Ht  (1.702 m)   Wt 202 lb (91.6 kg)   BMI 31.64 kg/m     Wt Readings from Last 3 Encounters:  09/10/18 202 lb (91.6 kg)  03/09/18 204 lb (92.5 kg)  10/20/16 210 lb 12.8 oz (95.6 kg)     GEN: Well nourished, well developed in no acute distress HEENT: Normal NECK: No JVD; No carotid bruits LYMPHATICS: No lymphadenopathy CARDIAC: regular rhythm, normal S1 and S2, no murmurs, rubs, gallops. Radial and DP pulses 2+ bilaterally. RESPIRATORY:  Clear to auscultation without rales, wheezing or rhonchi  ABDOMEN: Soft, non-tender,  non-distended MUSCULOSKELETAL:  No edema; No deformity  SKIN: Warm and dry NEUROLOGIC:  Alert and oriented x 3 PSYCHIATRIC:  Normal affect   ASSESSMENT:    1. SOB (shortness of breath)   2. Heart palpitations   3. Family history of heart disease   4. Cardiac risk counseling   5. Counseling on health promotion and disease prevention   6. Educated About Covid-19 Virus Infection    PLAN:    Shortness of breath, palpitations, family history of ASCVD (stroke) -ECG unremarkable, reports that palpitations are minor and heart rate is less of an issue now. Discussed monitor. As these symptoms are waning, will hold on monitor at this time, but she will let us know if this becomes more of an issue, and we will pursue a monitor at that time -shortness of breath has been progressive. Will get echocardiogram  to rule out structural/functional cardiac involvement.  -with family history of DM, HTN, stroke, discussed CV risk counseling and prevention, below  Cardiac risk counseling and prevention recommendations: -recommend heart healthy/Mediterranean diet, with whole grains, fruits, vegetable, fish, lean meats, nuts, and olive oil. Limit salt. -recommend moderate walking, 3-5 times/week for 30-50 minutes each session. Aim for at least 150 minutes.week. Goal should be pace of 3 miles/hours, or walking 1.5 miles in 30 minutes -recommend avoidance of tobacco products. Avoid excess alcohol. -ASCVD risk score: The 10-year ASCVD risk score Denman George(Goff [place] Montez HagemanJr., et al., 2013) is: 0.4%   Values used to calculate the score:     Age: [age] years     Sex: Female     Is Non-Hispanic African American: Yes     Diabetic: No     Tobacco smoker: No     Systolic Blood Pressure: 98 mmHg     Is BP treated: No     HDL Cholesterol: 59.5 mg/dL     Total Cholesterol: 210 mg/dL    Plan for follow up: TBD based on results of echo. If echo normal, would recommend she discuss if she needs any further testing, such as PFTs, with her  PCP. She will call us with any questions or concerns.  Medication Adjustments/Labs and Tests Ordered: Current medicines are reviewed at length with the patient today.  Concerns regarding medicines are outlined above.  Orders Placed This Encounter  Procedures  . ECHOCARDIOGRAM COMPLETE   No orders of the defined types were placed in this encounter.   Patient Instructions  Medication Instructions:  Your Physician recommend you continue on your current medication as directed.    If you need a refill on your cardiac medications before your next appointment, please call your pharmacy.   Lab work: None  Testing/Procedures: Your physician has requested that you have an echocardiogram. Echocardiography is a painless test that uses sound waves to create images of your heart. It provides your doctor with information about the size and shape of your heart and how well your heart's chambers and valves are working. This procedure takes approximately one hour. There are no restrictions for this procedure. [address]   Follow-Up: Follow up will be based of ECHO results.      Signed, Amanda RedBridgette Terrian Sentell, MD PhD 12/31/2018 3:46 PM    [facility]

## 2019-01-03 ENCOUNTER — Telehealth (HOSPITAL_COMMUNITY): Payer: Self-pay

## 2019-01-03 NOTE — Telephone Encounter (Signed)

## 2019-01-04 ENCOUNTER — Ambulatory Visit (HOSPITAL_COMMUNITY): Attending: Cardiology

## 2019-01-04 ENCOUNTER — Other Ambulatory Visit: Payer: Self-pay

## 2019-01-04 DIAGNOSIS — R0602 Shortness of breath: Secondary | ICD-10-CM | POA: Insufficient documentation

## 2019-01-19 ENCOUNTER — Encounter: Payer: Self-pay | Admitting: Cardiology

## 2019-01-24 MED ORDER — MIDAZOLAM HCL 2 MG/2ML IJ SOLN
INTRAMUSCULAR | Status: AC
  Filled 2019-01-24: qty 2

## 2019-01-24 MED ORDER — FENTANYL CITRATE (PF) 100 MCG/2ML IJ SOLN
INTRAMUSCULAR | Status: AC
  Filled 2019-01-24: qty 2

## 2019-01-24 MED ORDER — LIDOCAINE 2% (20 MG/ML) 5 ML SYRINGE
INTRAMUSCULAR | Status: AC
  Filled 2019-01-24: qty 10

## 2019-01-24 MED ORDER — DEXAMETHASONE SODIUM PHOSPHATE 10 MG/ML IJ SOLN
INTRAMUSCULAR | Status: AC
  Filled 2019-01-24: qty 1

## 2019-09-26 ENCOUNTER — Other Ambulatory Visit: Payer: Self-pay | Admitting: Family Medicine

## 2019-09-26 DIAGNOSIS — N951 Menopausal and female climacteric states: Secondary | ICD-10-CM

## 2019-12-09 ENCOUNTER — Other Ambulatory Visit

## 2020-02-09 ENCOUNTER — Other Ambulatory Visit

## 2020-03-16 ENCOUNTER — Other Ambulatory Visit

## 2020-04-12 ENCOUNTER — Other Ambulatory Visit: Payer: Self-pay | Admitting: Family Medicine

## 2020-04-12 DIAGNOSIS — M810 Age-related osteoporosis without current pathological fracture: Secondary | ICD-10-CM

## 2020-04-12 DIAGNOSIS — N951 Menopausal and female climacteric states: Secondary | ICD-10-CM

## 2020-04-13 ENCOUNTER — Other Ambulatory Visit: Payer: Self-pay | Admitting: Family Medicine

## 2020-04-13 DIAGNOSIS — Z1231 Encounter for screening mammogram for malignant neoplasm of breast: Secondary | ICD-10-CM

## 2020-04-16 ENCOUNTER — Other Ambulatory Visit

## 2020-07-03 ENCOUNTER — Other Ambulatory Visit: Payer: Self-pay | Admitting: Family Medicine

## 2020-07-03 ENCOUNTER — Ambulatory Visit
Admission: RE | Admit: 2020-07-03 | Discharge: 2020-07-03 | Disposition: A | Discharge status: Home / Self Care | Source: Ambulatory Visit | Attending: Family Medicine | Admitting: Family Medicine

## 2020-07-03 ENCOUNTER — Other Ambulatory Visit: Payer: Self-pay

## 2020-07-03 DIAGNOSIS — N951 Menopausal and female climacteric states: Secondary | ICD-10-CM

## 2020-07-03 DIAGNOSIS — M810 Age-related osteoporosis without current pathological fracture: Secondary | ICD-10-CM

## 2020-07-03 DIAGNOSIS — Z1231 Encounter for screening mammogram for malignant neoplasm of breast: Secondary | ICD-10-CM

## 2020-07-03 DIAGNOSIS — Q782 Osteopetrosis: Secondary | ICD-10-CM

## 2020-07-05 ENCOUNTER — Other Ambulatory Visit: Payer: Self-pay | Admitting: Family Medicine

## 2020-07-05 DIAGNOSIS — R928 Other abnormal and inconclusive findings on diagnostic imaging of breast: Secondary | ICD-10-CM

## 2020-07-17 ENCOUNTER — Other Ambulatory Visit: Payer: Self-pay | Admitting: Internal Medicine

## 2020-07-17 DIAGNOSIS — E785 Hyperlipidemia, unspecified: Secondary | ICD-10-CM

## 2020-07-18 ENCOUNTER — Ambulatory Visit

## 2020-07-18 ENCOUNTER — Ambulatory Visit
Admission: RE | Admit: 2020-07-18 | Discharge: 2020-07-18 | Disposition: A | Discharge status: Home / Self Care | Source: Ambulatory Visit | Attending: Family Medicine | Admitting: Family Medicine

## 2020-07-18 ENCOUNTER — Other Ambulatory Visit: Payer: Self-pay

## 2020-07-18 DIAGNOSIS — R928 Other abnormal and inconclusive findings on diagnostic imaging of breast: Secondary | ICD-10-CM

## 2020-08-13 ENCOUNTER — Other Ambulatory Visit

## 2020-09-05 ENCOUNTER — Inpatient Hospital Stay: Admission: RE | Admit: 2020-09-05 | Source: Ambulatory Visit

## 2020-09-21 ENCOUNTER — Ambulatory Visit
Admission: RE | Admit: 2020-09-21 | Discharge: 2020-09-21 | Disposition: A | Discharge status: Home / Self Care | Payer: No Typology Code available for payment source | Source: Ambulatory Visit | Attending: Internal Medicine | Admitting: Internal Medicine

## 2020-09-21 DIAGNOSIS — E785 Hyperlipidemia, unspecified: Secondary | ICD-10-CM

## 2020-10-01 ENCOUNTER — Other Ambulatory Visit: Payer: Self-pay | Admitting: Internal Medicine

## 2020-10-01 DIAGNOSIS — K769 Liver disease, unspecified: Secondary | ICD-10-CM

## 2020-10-29 ENCOUNTER — Other Ambulatory Visit

## 2021-03-19 ENCOUNTER — Encounter: Payer: Self-pay | Admitting: Gastroenterology

## 2021-05-29 ENCOUNTER — Ambulatory Visit

## 2021-05-29 ENCOUNTER — Telehealth: Payer: Self-pay

## 2021-05-29 NOTE — Telephone Encounter (Signed)
No show letter sent to patient as patient did not call back to the office to reschedule PV appt;

## 2021-05-29 NOTE — Telephone Encounter (Signed)
Attempted to reach patient-unable to reach patient and message left for patient to call back to the office to reschedule PV appt-no show letter will be sent to the patient if call not returned prior to end of business- and appts to be cancelled

## 2021-06-12 ENCOUNTER — Encounter: Admitting: Gastroenterology

## 2021-07-23 ENCOUNTER — Ambulatory Visit (AMBULATORY_SURGERY_CENTER): Admitting: *Deleted

## 2021-07-23 ENCOUNTER — Other Ambulatory Visit: Payer: Self-pay

## 2021-07-23 VITALS — Ht 67.0 in | Wt 203.0 lb

## 2021-07-23 DIAGNOSIS — Z1211 Encounter for screening for malignant neoplasm of colon: Secondary | ICD-10-CM

## 2021-07-23 NOTE — Progress Notes (Signed)
PV completed over the phone. Pt verified name, DOB, address and insurance during PV today.  Pt mailed instruction packet with copy of consent form to read and not return, and instructions.   Pt encouraged to call with questions or issues.  If pt has My chart, procedure instructions sent via My Chart   No egg or soy allergy known to patient  No issues known to pt with past sedation with any surgeries or procedures Patient denies past  intubation  No FH of Malignant Hyperthermia Pt is not on diet pills Pt is not on  home 02  Pt is not on blood thinners  Pt states  issues with constipation but not daily- uses Miralax PRN and relieves constipation  No A fib or A flutter  Pt is fully vaccinated  for Covid     NO PA's for preps discussed with pt In PV today  Discussed with pt there will be an out-of-pocket cost for prep and that varies from $0 to 70 +  dollars - pt verbalized understanding   Due to the COVID-19 pandemic we are asking patients to follow certain guidelines in PV and the [facility]   Pt aware of COVID protocols and [facility] guidelines

## 2021-07-31 ENCOUNTER — Encounter: Payer: Self-pay | Admitting: Gastroenterology

## 2021-07-31 ENCOUNTER — Other Ambulatory Visit: Payer: Self-pay | Admitting: Internal Medicine

## 2021-07-31 ENCOUNTER — Ambulatory Visit (AMBULATORY_SURGERY_CENTER): Admitting: Gastroenterology

## 2021-07-31 ENCOUNTER — Other Ambulatory Visit: Payer: Self-pay

## 2021-07-31 VITALS — BP 136/82 | HR 61 | Temp 97.9°F | Resp 11 | Ht 67.0 in | Wt 203.9 lb

## 2021-07-31 DIAGNOSIS — Z1211 Encounter for screening for malignant neoplasm of colon: Secondary | ICD-10-CM

## 2021-07-31 DIAGNOSIS — K5900 Constipation, unspecified: Secondary | ICD-10-CM

## 2021-07-31 DIAGNOSIS — Z538 Procedure and treatment not carried out for other reasons: Secondary | ICD-10-CM | POA: Diagnosis not present

## 2021-07-31 DIAGNOSIS — K769 Liver disease, unspecified: Secondary | ICD-10-CM

## 2021-07-31 MED ORDER — SODIUM CHLORIDE 0.9 % IV SOLN: 500.0000 mL | Freq: Once | INTRAVENOUS | Status: DC

## 2021-07-31 NOTE — Progress Notes (Signed)
To PACU, VSS. Report to [email or web address] 

## 2021-07-31 NOTE — Progress Notes (Signed)
   Referring Provider: Cleatis Polka., MD Primary Care Physician:  Cleatis Polka., MD  Reason for Procedure:  Colon cancer screening   IMPRESSION:  Need for colon cancer screening Appropriate candidate for monitored anesthesia care  PLAN: Colonoscopy in the [facility] today   HPI: Amanda Petersen is a 50 y.o. female presents for screening colonoscopy.  Prior colonoscopy many years ago during the evaluation of endometriosis.   No baseline GI symptoms except for chronic constipation treated with Miralax.   No known family history of colon cancer or polyps. No family history of uterine/endometrial cancer, pancreatic cancer or gastric/stomach cancer.   Past Medical History:  Diagnosis Date   Allergy    Hyperlipidemia    no meds    Past Surgical History:  Procedure Laterality Date   ABDOMINAL HYSTERECTOMY     full   COLONOSCOPY     2007 Dr Virginia Rochester    Current Outpatient Medications  Medication Sig Dispense Refill   AZELASTINE-FLUTICASONE NA Place into the nose.     levocetirizine (XYZAL) 5 MG tablet Take by mouth.     polyethylene glycol (MIRALAX / GLYCOLAX) packet Take 17 g by mouth daily.     Beclomethasone Dipropionate (QVAR IN) Inhale into the lungs. (Patient not taking: Reported on 07/23/2021)     levalbuterol (XOPENEX HFA) 45 MCG/ACT inhaler Inhale 2 puffs into the lungs every 4 (four) hours as needed. (Patient not taking: Reported on 07/23/2021)     Current Facility-Administered Medications  Medication Dose Route Frequency Provider Last Rate Last Admin   0.9 %  sodium chloride infusion  500 mL Intravenous Once Tressia Danas, MD        Allergies as of 07/31/2021 - Review Complete 07/31/2021  Allergen Reaction Noted   Codeine  12/31/2018   Penicillins  04/29/2007   Sudafed [pseudoephedrine hcl]  12/31/2018    Family History  Problem Relation Age of Onset   Hypertension Mother    Asthma Mother    Hypertension Father    Cancer Father    Diabetes  Sister    Diabetes Brother    Colon cancer Neg Hx    Colon polyps Neg Hx    Esophageal cancer Neg Hx    Rectal cancer Neg Hx    Stomach cancer Neg Hx      Physical Exam: General:   Alert,  well-nourished, pleasant and cooperative in NAD Head:  Normocephalic and atraumatic. Eyes:  Sclera clear, no icterus.   Conjunctiva pink. Mouth:  No deformity or lesions.   Neck:  Supple; no masses or thyromegaly. Lungs:  Clear throughout to auscultation.   No wheezes. Heart:  Regular rate and rhythm; no murmurs. Abdomen:  Soft, non-tender, nondistended, normal bowel sounds, no rebound or guarding.  Msk:  Symmetrical. No boney deformities LAD: No inguinal or umbilical LAD Extremities:  No clubbing or edema. Neurologic:  Alert and  oriented x4;  grossly nonfocal Skin:  No obvious rash or bruise. Psych:  Alert and cooperative. Normal mood and affect.     Studies/Results: No results found.    Kameryn Tisdel L. Orvan Falconer, MD, MPH 07/31/2021, 9:16 AM

## 2021-07-31 NOTE — Op Note (Addendum)
[facility] Patient Name: Amanda Petersen Procedure Date: 07/31/2021 9:13 AM MRN: [id number] Endoscopist: Tressia Danas MD, MD Age: 50 Referring MD:  Date of Birth: [birth year] Gender: Female Account #: [id number] Procedure:                Colonoscopy Indications:              Screening for colorectal malignant neoplasm, This                            is the patient's first colonoscopy, Incidental                            constipation noted                           No known family history of colon cancer or polyps Medicines:                Monitored Anesthesia Care Procedure:                Pre-Anesthesia Assessment:                           - Prior to the procedure, a History and Physical                            was performed, and patient medications and                            allergies were reviewed. The patient's tolerance of                            previous anesthesia was also reviewed. The risks                            and benefits of the procedure and the sedation                            options and risks were discussed with the patient.                            All questions were answered, and informed consent                            was obtained. Prior Anticoagulants: The patient has                            taken no previous anticoagulant or antiplatelet                            agents. ASA Grade Assessment: II - A patient with                            mild systemic disease. After reviewing the risks  and benefits, the patient was deemed in                            satisfactory condition to undergo the procedure.                           After obtaining informed consent, the colonoscope                            was passed under direct vision. Throughout the                            procedure, the patient's blood pressure, pulse, and                            oxygen saturations were monitored  continuously. The                            Olympus CF-HQ190L [id number]) Colonoscope was                            introduced through the anus and advanced to the the                            cecum, identified by appendiceal orifice and                            ileocecal valve. The colonoscopy was performed with                            moderate difficulty due to inadequate bowel prep.                            The patient tolerated the procedure well. The                            quality of the bowel preparation was poor. The                            terminal ileum, ileocecal valve, appendiceal                            orifice, and rectum were photographed. Scope In: 9:23:25 AM Scope Out: 9:33:13 AM Scope Withdrawal Time: 0 hours 7 minutes 5 seconds  Total Procedure Duration: 0 hours 9 minutes 48 seconds  Findings:                 The perianal and digital rectal examinations were                            normal.                           A moderate amount of stool was found in the sigmoid  colon, in the transverse colon and in the ascending                            colon, interfering with visualization. Flat, small,                            and medium sized polyps could not be excluded                            during this procedure.                           Evidence for widely patent colocolonic anastomosis                            in the mid rectum.                           The exam was otherwise without abnormality on                            direct and retroflexion views. Complications:            No immediate complications. Estimated blood loss:                            Minimal. Estimated Blood Loss:     Estimated blood loss was minimal. Impression:               - Preparation of the colon was poor. This was not                            an adequate study for colon cancer screening.                           - Widely patent  colocolonic anastomosis in the mid                            rectum. Recommendation:           - Patient has a contact number available for                            emergencies. The signs and symptoms of potential                            delayed complications were discussed with the                            patient. Return to normal activities tomorrow.                            Written discharge instructions were provided to the                            patient.                           -  Resume previous diet.                           - Continue present medications including Miralax.                           - Repeat colonoscopy at the next available                            appointment because the bowel preparation was poor                            using a 2 day bowel prep at that time.                           - Emerging evidence supports eating a diet of                            fruits, vegetables, grains, calcium, and yogurt                            while reducing red meat and alcohol may reduce the                            risk of colon cancer.                           - Thank you for allowing me to be involved in your                            colon cancer prevention. Tressia Danas MD, MD 07/31/2021 9:42:40 AM This report has been signed electronically.

## 2021-07-31 NOTE — Progress Notes (Signed)
Patient wants to wait to schedule repeat colonoscopy. Recall placed [id number].

## 2021-07-31 NOTE — Patient Instructions (Signed)
YOU HAD AN ENDOSCOPIC PROCEDURE TODAY AT THE [facility]:   Refer to the procedure report that was given to you for any specific questions about what was found during the examination.  If the procedure report does not answer your questions, please call your gastroenterologist to clarify.  If you requested that your care partner not be given the details of your procedure findings, then the procedure report has been included in a sealed envelope for you to review at your convenience later. ° °YOU SHOULD EXPECT: Some feelings of bloating in the abdomen. Passage of more gas than usual.  Walking can help get rid of the air that was put into your GI tract during the procedure and reduce the bloating. If you had a lower endoscopy (such as a colonoscopy or flexible sigmoidoscopy) you may notice spotting of blood in your stool or on the toilet paper. If you underwent a bowel prep for your procedure, you may not have a normal bowel movement for a few days. ° °Please Note:  You might notice some irritation and congestion in your nose or some drainage.  This is from the oxygen used during your procedure.  There is no need for concern and it should clear up in a day or so. ° °SYMPTOMS TO REPORT IMMEDIATELY: ° °Following lower endoscopy (colonoscopy or flexible sigmoidoscopy): ° Excessive amounts of blood in the stool ° Significant tenderness or worsening of abdominal pains ° Swelling of the abdomen that is new, acute ° Fever of 100°F or higher ° °For urgent or emergent issues, a gastroenterologist can be reached at any hour by calling [phone]. °Do not use MyChart messaging for urgent concerns.  ° ° °DIET:  We do recommend a small meal at first, but then you may proceed to your regular diet.  Drink plenty of fluids but you should avoid alcoholic beverages for 24 hours. ° °ACTIVITY:  You should plan to take it easy for the rest of today and you should NOT DRIVE or use heavy machinery until tomorrow (because of  the sedation medicines used during the test).   ° °FOLLOW UP: °Our staff will call the number listed on your records 48-72 hours following your procedure to check on you and address any questions or concerns that you may have regarding the information given to you following your procedure. If we do not reach you, we will leave a message.  We will attempt to reach you two times.  During this call, we will ask if you have developed any symptoms of COVID 19. If you develop any symptoms (ie: fever, flu-like symptoms, shortness of breath, cough etc.) before then, please call [phone].  If you test positive for Covid 19 in the 2 weeks post procedure, please call and report this information to us.   ° °SIGNATURES/CONFIDENTIALITY: °You and/or your care partner have signed paperwork which will be entered into your electronic medical record.  These signatures attest to the fact that that the information above on your After Visit Summary has been reviewed and is understood.  Full responsibility of the confidentiality of this discharge information lies with you and/or your care-partner.  °

## 2021-08-01 ENCOUNTER — Telehealth: Payer: Self-pay

## 2021-08-01 NOTE — Telephone Encounter (Signed)
Received notification from Dr. Orvan Falconer that pt had poor prep and will require screening colon to be rescheduled to next available date. Will require 2 day prep. LVM requesting returned call.

## 2021-08-02 ENCOUNTER — Telehealth: Payer: Self-pay

## 2021-08-02 NOTE — Telephone Encounter (Signed)
Patient returned call

## 2021-08-02 NOTE — Telephone Encounter (Signed)
SECOND ATTEMPT: ° °LVM requesting returned call. °

## 2021-08-02 NOTE — Telephone Encounter (Signed)
Pt returned call. Informed her of need to reschedule colonoscopy with 2 day prep. Pt states she is moving to [place], [place] and will call to reschedule when she is ready. No further action required at this time.

## 2021-08-02 NOTE — Telephone Encounter (Signed)
  Follow up Call-  Call back number 07/31/2021  Post procedure Call Back phone  # [phone]  Permission to leave phone message Yes  Some recent data might be hidden     Patient questions:  Do you have a fever, pain , or abdominal swelling? No. Pain Score  0 *  Have you tolerated food without any problems? Yes.    Have you been able to return to your normal activities? Yes.    Do you have any questions about your discharge instructions: Diet   No. Medications  No. Follow up visit  No.  Do you have questions or concerns about your Care? No.  Actions: * If pain score is 4 or above: No action needed, pain <4.  Have you developed a fever since your procedure? no  2.   Have you had an respiratory symptoms (SOB or cough) since your procedure? no  3.   Have you tested positive for COVID 19 since your procedure no  4.   Have you had any family members/close contacts diagnosed with the COVID 19 since your procedure?  no   If yes to any of these questions please route to Laverna Peace, RN and Karlton Lemon, RN
